# Patient Record
Sex: Male | Born: 1985 | Race: White | Hispanic: No | Marital: Single | Smoking: Current every day smoker
Health system: Southern US, Community
[De-identification: ages and names within clinical notes are randomized; demographics above are authoritative.]

## PROBLEM LIST (undated history)

## (undated) DIAGNOSIS — I10 Essential (primary) hypertension: Secondary | ICD-10-CM

---

## 2011-10-30 ENCOUNTER — Emergency Department: Payer: Self-pay | Admitting: Emergency Medicine

## 2011-10-30 LAB — URINALYSIS, COMPLETE
Ketone: NEGATIVE
Leukocyte Esterase: NEGATIVE
Ph: 6 (ref 4.5–8.0)
RBC,UR: 1 /HPF (ref 0–5)
Specific Gravity: 1.027 (ref 1.003–1.030)

## 2011-11-26 ENCOUNTER — Emergency Department: Payer: Self-pay | Admitting: Emergency Medicine

## 2011-12-01 ENCOUNTER — Ambulatory Visit: Payer: Self-pay | Admitting: Specialist

## 2011-12-16 ENCOUNTER — Ambulatory Visit: Payer: Self-pay | Admitting: Specialist

## 2012-10-21 ENCOUNTER — Emergency Department: Payer: Self-pay | Admitting: Emergency Medicine

## 2014-10-01 NOTE — Op Note (Signed)
PATIENT NAME:  Edward Maldonado, Edward Maldonado MR#:  [id number] DATE OF BIRTH:  06-30-1985  DATE OF PROCEDURE:  12/16/2011  PREOPERATIVE DIAGNOSES:    1. Complex displaced tear right lateral meniscus anterior half.  2. Loose bodies intercondylar notch and lateral femoral condyle.  3. 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   POSTOPERATIVE DIAGNOSES:    1. Complex displaced tear right lateral meniscus anterior half.  2. Loose bodies intercondylar notch and lateral femoral condyle.  3. 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   OPERATIONS:  1. Arthroscopic partial right lateral meniscectomy.  2. Arthroscopic loose body removal, right, x2.  3. Arthroscopic chondroplasty of the lateral femoral condyle. 4. Arthroscopic synovectomy and debridement.   SURGEON: Valinda Hoar, M.D.   ANESTHESIA: General LMA.   COMPLICATIONS: None.  DRAINS: None.   OPERATIVE FINDINGS: The patient had a loose body in the intercondylar notch and another loose body along the lateral femoral condyle. He had extensive scarring and synovitis in the lateral compartment anteriorly and laterally in the gutter. The anterior cruciate was intact and fairly stable. The medial compartment was normal. The lateral compartment showed a complex macerated tear of the lateral anterior half of the meniscus which was flipped on itself and blocking extension. The posterior half of the meniscus was intact and most of the weight-bearing surface of the lateral femoral condyle was intact. The suprapatellar pouch showed old scarring. The patellofemoral joint was normal.   OPERATIVE PROCEDURE: The patient was brought to the Operating Room where he underwent satisfactory general LMA anesthesia in the supine position. The right leg was prepped and draped in sterile fashion.  Arthroscopy was carried out through standard portals. The patient's history included surgery at age 29 or 15 for an avulsion type tibial spine injury. He had had problems with the knee on and off for years, but recently had acute pain and locking and inability to extend the knee. The above findings were encountered. The loose bodies were removed with the pituitary rongeurs. The anterolateral portion of the joint was debrided for exposure. Electrocautery was used for hemostasis. The probe showed the lateral meniscus was macerated and torn severely in the anterior half. This was resected with a motorized resector. This provided better visualization. The posterior half of the lateral compartment was more normal with good meniscus and weight-bearing surface. There was a large defect in the anterolateral aspect of the femoral condyle which was mostly nonweightbearing. This was debrided. The anterior cruciate was probed. There was slight loosening but to anterior drawer testing, it was fairly stable. The medial compartment was normal. The suprapatellar pouch had scarring which was debrided with the ArthroCare wand. The medial and lateral gutters were cleaned out. The joint was thoroughly irrigated. Pressures were reduced to allow for cautery of any bleeders. Once this was completed at the knee, we were able to bring him in full extension and good flexion. No other loose bodies were noted. The joint was suctioned dry and stab wound was closed with 3-0 nylon suture. 0.5% Marcaine with epinephrine and morphine were placed in the joint. Dry sterile compression dressing was applied. The tourniquet was not used. The patient was awakened and taken to recovery in good condition.  ____________________________ Valinda Hoar, MD hem:ap D: 12/16/2011 11:29:25 ET T: 12/16/2011 11:39:38 ET JOB#: [id number]  cc: Dimas Aguas  Samara DeistE. Karem Farha, MD, <Dictator> Valinda HoarHOWARD E Roc Streett MD ELECTRONICALLY SIGNED 12/19/2011 16:03

## 2019-03-14 ENCOUNTER — Emergency Department
Admission: EM | Admit: 2019-03-14 | Discharge: 2019-03-14 | Disposition: A | Discharge status: Home / Self Care | Payer: Self-pay | Attending: Emergency Medicine | Admitting: Emergency Medicine

## 2019-03-14 ENCOUNTER — Other Ambulatory Visit: Payer: Self-pay

## 2019-03-14 ENCOUNTER — Emergency Department: Payer: Self-pay

## 2019-03-14 DIAGNOSIS — N12 Tubulo-interstitial nephritis, not specified as acute or chronic: Secondary | ICD-10-CM | POA: Insufficient documentation

## 2019-03-14 DIAGNOSIS — F17200 Nicotine dependence, unspecified, uncomplicated: Secondary | ICD-10-CM | POA: Insufficient documentation

## 2019-03-14 DIAGNOSIS — R109 Unspecified abdominal pain: Secondary | ICD-10-CM

## 2019-03-14 LAB — CBC
HCT: 47.6 % (ref 39.0–52.0)
Hemoglobin: 17.3 g/dL — ABNORMAL HIGH (ref 13.0–17.0)
MCH: 31.9 pg (ref 26.0–34.0)
MCHC: 36.3 g/dL — ABNORMAL HIGH (ref 30.0–36.0)
MCV: 87.7 fL (ref 80.0–100.0)
Platelets: 179 10*3/uL (ref 150–400)
RBC: 5.43 MIL/uL (ref 4.22–5.81)
RDW: 13 % (ref 11.5–15.5)
WBC: 13.6 10*3/uL — ABNORMAL HIGH (ref 4.0–10.5)
nRBC: 0 % (ref 0.0–0.2)

## 2019-03-14 LAB — URINALYSIS, COMPLETE (UACMP) WITH MICROSCOPIC
Bilirubin Urine: NEGATIVE
Glucose, UA: NEGATIVE mg/dL
Ketones, ur: NEGATIVE mg/dL
Leukocytes,Ua: NEGATIVE
Nitrite: NEGATIVE
Protein, ur: 100 mg/dL — AB
Specific Gravity, Urine: 1.005 (ref 1.005–1.030)
pH: 5 (ref 5.0–8.0)

## 2019-03-14 LAB — COMPREHENSIVE METABOLIC PANEL
ALT: 34 U/L (ref 0–44)
AST: 42 U/L — ABNORMAL HIGH (ref 15–41)
Albumin: 4.3 g/dL (ref 3.5–5.0)
Alkaline Phosphatase: 74 U/L (ref 38–126)
Anion gap: 13 (ref 5–15)
BUN: 14 mg/dL (ref 6–20)
CO2: 21 mmol/L — ABNORMAL LOW (ref 22–32)
Calcium: 10 mg/dL (ref 8.9–10.3)
Chloride: 99 mmol/L (ref 98–111)
Creatinine, Ser: 1.98 mg/dL — ABNORMAL HIGH (ref 0.61–1.24)
GFR calc Af Amer: 50 mL/min — ABNORMAL LOW (ref >60)
GFR calc non Af Amer: 43 mL/min — ABNORMAL LOW (ref >60)
Glucose, Bld: 132 mg/dL — ABNORMAL HIGH (ref 70–99)
Potassium: 3.3 mmol/L — ABNORMAL LOW (ref 3.5–5.1)
Sodium: 133 mmol/L — ABNORMAL LOW (ref 135–145)
Total Bilirubin: 1 mg/dL (ref 0.3–1.2)
Total Protein: 7.2 g/dL (ref 6.5–8.1)

## 2019-03-14 LAB — LIPASE, BLOOD: Lipase: 22 U/L (ref 11–51)

## 2019-03-14 MED ORDER — IOHEXOL 300 MG/ML  SOLN
75.0000 mL | Freq: Once | INTRAMUSCULAR | Status: AC | PRN
  Administered 2019-03-14: 14:00:00 75 mL via INTRAVENOUS
  Filled 2019-03-14: qty 75

## 2019-03-14 MED ORDER — IOHEXOL 9 MG/ML PO SOLN
500.0000 mL | ORAL | Status: AC
  Administered 2019-03-14: 13:00:00 500 mL via ORAL
  Filled 2019-03-14 (×2): qty 500

## 2019-03-14 MED ORDER — ONDANSETRON 4 MG PO TBDP: 4.0000 mg | ORAL_TABLET | Freq: Three times a day (TID) | ORAL | 0 refills | Status: DC | PRN

## 2019-03-14 MED ORDER — MORPHINE SULFATE (PF) 4 MG/ML IV SOLN
4.0000 mg | Freq: Once | INTRAVENOUS | Status: AC
  Administered 2019-03-14: 4 mg via INTRAVENOUS
  Filled 2019-03-14: qty 1

## 2019-03-14 MED ORDER — MORPHINE SULFATE (PF) 4 MG/ML IV SOLN
4.0000 mg | Freq: Once | INTRAVENOUS | Status: AC
  Administered 2019-03-14: 14:00:00 4 mg via INTRAVENOUS
  Filled 2019-03-14: qty 1

## 2019-03-14 MED ORDER — SODIUM CHLORIDE 0.9 % IV BOLUS
1000.0000 mL | Freq: Once | INTRAVENOUS | Status: AC
  Administered 2019-03-14: 14:00:00 1000 mL via INTRAVENOUS

## 2019-03-14 MED ORDER — HYDROCODONE-ACETAMINOPHEN 5-325 MG PO TABS: 1.0000 | ORAL_TABLET | ORAL | 0 refills | Status: DC | PRN

## 2019-03-14 MED ORDER — SODIUM CHLORIDE 0.9% FLUSH
3.0000 mL | Freq: Once | INTRAVENOUS | Status: AC
  Administered 2019-03-14: 3 mL via INTRAVENOUS

## 2019-03-14 MED ORDER — ONDANSETRON HCL 4 MG/2ML IJ SOLN
4.0000 mg | Freq: Once | INTRAMUSCULAR | Status: AC
  Administered 2019-03-14: 4 mg via INTRAVENOUS
  Filled 2019-03-14: qty 2

## 2019-03-14 MED ORDER — SODIUM CHLORIDE 0.9 % IV SOLN
1.0000 g | Freq: Once | INTRAVENOUS | Status: AC
  Administered 2019-03-14: 1 g via INTRAVENOUS
  Filled 2019-03-14: qty 10

## 2019-03-14 MED ORDER — CEPHALEXIN 500 MG PO CAPS: 500.0000 mg | ORAL_CAPSULE | Freq: Two times a day (BID) | ORAL | 0 refills | Status: DC

## 2019-03-14 NOTE — ED Notes (Signed)
Pt up to toilet 

## 2019-03-14 NOTE — ED Notes (Signed)
Patient transported to CT 

## 2019-03-14 NOTE — ED Triage Notes (Signed)
Pt comes via POV from home with c/o lower back pain that radiates around to belly.  Pt denies any urinary symptoms.  Pt states dark stools. Pt states this all started last night. Pt denies any N/V/D.

## 2019-03-14 NOTE — ED Notes (Signed)
Pt ambulatory to treatment bed but reporting pain has started to worsen in back and lower abd after sitting in chairs in lobby. Pt hunching slightly when walking but reports slight ease in pain when laying down.

## 2019-03-14 NOTE — ED Provider Notes (Signed)
[facility] Emergency Department Provider Note  Time seen: 1:29 PM  I have reviewed the triage vital signs and the nursing notes.   HISTORY  Chief Complaint Abdominal Pain   HPI Edward Maldonado is a 33 y.o. male with no significant past medical history presents to the emergency department for lower abdominal pain and back pain.  According to the patient since last night he has been experiencing pain in his bilateral flanks radiating around to his lower abdomen.  Denies any fever.  Denies any shortness of breath.  Denies any nausea vomiting diarrhea dysuria or hematuria.  Patient states he got minimal sleep last night due to the discomfort.  Describes as a moderate aching pain in his bilateral flanks radiating around to his bilateral lower abdomen.   History reviewed. No pertinent past medical history.  There are no active problems to display for this patient.   History reviewed. No pertinent surgical history.  Prior to Admission medications   Not on File    Not on File  No family history on file.  Social History Social History   Tobacco Use  . Smoking status: Current Every Day Smoker  . Smokeless tobacco: Never Used  Substance Use Topics  . Alcohol use: Never    Frequency: Never  . Drug use: Never    Review of Systems Constitutional: Negative for fever. Cardiovascular: Negative for chest pain. Respiratory: Negative for shortness of breath. Gastrointestinal: Positive for bilateral flank pain, lower abdominal pain.  Negative for nausea vomiting or diarrhea. Genitourinary: Negative for urinary compaints Musculoskeletal: Negative for musculoskeletal complaints Skin: Negative for skin complaints  Neurological: Negative for headache All other ROS negative  ____________________________________________   PHYSICAL EXAM:  VITAL SIGNS: ED Triage Vitals [03/14/19 1153]  Enc Vitals Group     BP (!) 157/102     Pulse Rate 80     Resp 19   Temp 98.3 F (36.8 C)     Temp src      SpO2 100 %     Weight 215 lb (97.5 kg)     Height 5\' 11"  (1.803 m)     Head Circumference      Peak Flow      Pain Score 10     Pain Loc      Pain Edu?      Excl. in GC?    Constitutional: Alert and oriented. Well appearing and in no distress. Eyes: Normal exam ENT      Head: Normocephalic and atraumatic.      Mouth/Throat: Mucous membranes are moist. Cardiovascular: Normal rate, regular rhythm.  Respiratory: Normal respiratory effort without tachypnea nor retractions. Breath sounds are clear Gastrointestinal: Soft and nontender. No distention.  Musculoskeletal: Nontender with normal range of motion in all extremities.  Neurologic:  Normal speech and language. No gross focal neurologic deficits Skin:  Skin is warm, dry and intact.  Psychiatric: Mood and affect are normal.   ____________________________________________     RADIOLOGY  CT scan shows suspicion for bilateral pyelonephritis.  Remainder CT is negative.  ____________________________________________   INITIAL IMPRESSION / ASSESSMENT AND PLAN / ED COURSE  Pertinent labs & imaging results that were available during my care of the patient were reviewed by me and considered in my medical decision making (see chart for details).   Patient presents to the emergency department for abdominal/flank pain.  Differential would include ureterolithiasis, UTI or pyelonephritis, colitis or diverticulitis.  Patient's lab work has resulted showing a moderate leukocytosis.  Given the patient's complaint of abdominal pain with leukocytosis we will proceed with CT imaging of the abdomen/pelvis to rule out intra-abdominal pathology.  Patient agreeable to plan of care.  CT is suspicious for possible bilateral pyelonephritis.  Patient's urinalysis overall is reassuring however there is rare bacteria seen.  We will cover with antibiotics.  We will send a urine culture.  We will discharge with  antibiotics pain and nausea medication have the patient follow-up with nephrology and a PCP.  Patient agreeable to plan of care.  Patient's lab work did show renal insufficiency.  No prior lab work for comparison however when I discussed this with the patient he states he has been told before that his kidneys were not working 100%, possible chronic renal insufficiency however given these findings patient would still benefit from nephrology follow-up.  Edward Maldonado was evaluated in Emergency Department on 03/14/2019 for the symptoms described in the history of present illness. He was evaluated in the context of the global COVID-19 pandemic, which necessitated consideration that the patient might be at risk for infection with the SARS-CoV-2 virus that causes COVID-19. Institutional protocols and algorithms that pertain to the evaluation of patients at risk for COVID-19 are in a state of rapid change based on information released by regulatory bodies including the CDC and federal and state organizations. These policies and algorithms were followed during the patient's care in the ED.  ____________________________________________   FINAL CLINICAL IMPRESSION(S) / ED DIAGNOSES  Abdominal pain Pyelonephritis   Harvest Dark, MD 03/14/19 1515

## 2019-03-15 LAB — URINE CULTURE: Culture: NO GROWTH

## 2019-04-26 ENCOUNTER — Other Ambulatory Visit: Payer: Self-pay

## 2019-04-26 DIAGNOSIS — Z20822 Contact with and (suspected) exposure to covid-19: Secondary | ICD-10-CM

## 2019-04-28 LAB — NOVEL CORONAVIRUS, NAA: SARS-CoV-2, NAA: NOT DETECTED

## 2019-06-27 ENCOUNTER — Ambulatory Visit: Payer: Self-pay | Attending: Internal Medicine

## 2019-06-27 DIAGNOSIS — Z20822 Contact with and (suspected) exposure to covid-19: Secondary | ICD-10-CM | POA: Insufficient documentation

## 2019-06-28 LAB — NOVEL CORONAVIRUS, NAA: SARS-CoV-2, NAA: NOT DETECTED

## 2019-10-26 ENCOUNTER — Other Ambulatory Visit: Payer: Self-pay

## 2019-10-26 ENCOUNTER — Encounter: Payer: Self-pay | Admitting: Emergency Medicine

## 2019-10-26 ENCOUNTER — Emergency Department
Admission: EM | Admit: 2019-10-26 | Discharge: 2019-10-26 | Disposition: A | Discharge status: Home / Self Care | Payer: Self-pay | Attending: Emergency Medicine | Admitting: Emergency Medicine

## 2019-10-26 ENCOUNTER — Emergency Department: Payer: Self-pay

## 2019-10-26 DIAGNOSIS — B349 Viral infection, unspecified: Secondary | ICD-10-CM

## 2019-10-26 DIAGNOSIS — F17200 Nicotine dependence, unspecified, uncomplicated: Secondary | ICD-10-CM | POA: Insufficient documentation

## 2019-10-26 DIAGNOSIS — Z20822 Contact with and (suspected) exposure to covid-19: Secondary | ICD-10-CM | POA: Insufficient documentation

## 2019-10-26 DIAGNOSIS — Z79899 Other long term (current) drug therapy: Secondary | ICD-10-CM | POA: Insufficient documentation

## 2019-10-26 LAB — SARS CORONAVIRUS 2 BY RT PCR (HOSPITAL ORDER, PERFORMED IN ~~LOC~~ HOSPITAL LAB): SARS Coronavirus 2: NEGATIVE

## 2019-10-26 MED ORDER — GUAIFENESIN-CODEINE 100-10 MG/5ML PO SYRP: 5.0000 mL | ORAL_SOLUTION | Freq: Three times a day (TID) | ORAL | 0 refills | Status: DC | PRN

## 2019-10-26 MED ORDER — PREDNISONE 10 MG (21) PO TBPK: ORAL_TABLET | ORAL | 0 refills | Status: DC

## 2019-10-26 MED ORDER — IBUPROFEN 600 MG PO TABS: 600.0000 mg | ORAL_TABLET | Freq: Four times a day (QID) | ORAL | 0 refills | Status: DC | PRN

## 2019-10-26 NOTE — ED Provider Notes (Signed)
[facility] Emergency Department Provider Note  ____________________________________________  Time seen: Approximately 1:24 PM  I have reviewed the triage vital signs and the nursing notes.   HISTORY  Chief Complaint URI, Otalgia, Fever, Cough, and Generalized Body Aches   HPI Edward Maldonado is a 34 y.o. male who presents to the emergency department for treatment and evaluation of fever, cough, earache, and generalized body aches x 2 days. No relief with tylenol.     History reviewed. No pertinent past medical history.  There are no problems to display for this patient.   History reviewed. No pertinent surgical history.  Prior to Admission medications   Medication Sig Start Date End Date Taking? Authorizing Provider  cephALEXin (KEFLEX) 500 MG capsule Take 1 capsule (500 mg total) by mouth 2 (two) times daily. 03/14/19   Minna Antis, MD  guaiFENesin-codeine (ROBITUSSIN AC) 100-10 MG/5ML syrup Take 5 mLs by mouth 3 (three) times daily as needed for cough. 10/26/19   Triplett, Rulon Eisenmenger B, FNP  HYDROcodone-acetaminophen (NORCO/VICODIN) 5-325 MG tablet Take 1 tablet by mouth every 4 (four) hours as needed. 03/14/19   Minna Antis, MD  ibuprofen (ADVIL) 600 MG tablet Take 1 tablet (600 mg total) by mouth every 6 (six) hours as needed. 10/26/19   Triplett, Cari B, FNP  ondansetron (ZOFRAN ODT) 4 MG disintegrating tablet Take 1 tablet (4 mg total) by mouth every 8 (eight) hours as needed for nausea or vomiting. 03/14/19   Minna Antis, MD  predniSONE (STERAPRED UNI-PAK 21 TAB) 10 MG (21) TBPK tablet Take 6 tablets on the first day and decrease by 1 tablet each day until finished. 10/26/19   Chinita Pester, FNP    Allergies Patient has no allergy information on record.  No family history on file.  Social History Social History   Tobacco Use  . Smoking status: Current Every Day Smoker  . Smokeless tobacco: Never Used  Substance Use Topics   . Alcohol use: Never  . Drug use: Never    Review of Systems Constitutional: Positive fever/chills. Decreased appetite. ENT: Positive for sore throat. Cardiovascular: Denies chest pain. Respiratory: Negative for shortness of breath. Positive for cough. Positive wheezing.  Gastrointestinal: Negative for  nausea,  no vomiting.  No diarrhea.  Musculoskeletal: Positive for body aches Skin: Negative for rash. Neurological: Positive for headaches ____________________________________________   PHYSICAL EXAM:  VITAL SIGNS: ED Triage Vitals  Enc Vitals Group     BP 10/26/19 1039 (!) 154/93     Pulse Rate 10/26/19 1039 86     Resp 10/26/19 1039 16     Temp 10/26/19 1039 98 F (36.7 C)     Temp Source 10/26/19 1039 Oral     SpO2 10/26/19 1039 96 %     Weight 10/26/19 1037 215 lb (97.5 kg)     Height 10/26/19 1037 5\' 11"  (1.803 m)     Head Circumference --      Peak Flow --      Pain Score 10/26/19 1037 7     Pain Loc --      Pain Edu? --      Excl. in GC? --     Constitutional: Alert and oriented. Acutely ill appearing and in no acute distress. Eyes: Conjunctivae are normal. Ears: Bilateral TM injected. Nose: Maxillary sinus congestion noted; no rhinnorhea. Mouth/Throat: Mucous membranes are moist.  Oropharynx erythematou. Tonsils without exudate. Uvula midline. Neck: No stridor.  Lymphatic: No cervical lymphadenopathy. Cardiovascular: Normal rate, regular rhythm. Good  peripheral circulation. Respiratory: Respirations are even and unlabored.  No retractions. Wheezing left upper lobe. Gastrointestinal: Soft and nontender.  Musculoskeletal: FROM x 4 extremities.  Neurologic:  Normal speech and language. Skin:  Skin is warm, dry and intact. No rash noted. Psychiatric: Mood and affect are normal. Speech and behavior are normal.  ____________________________________________   LABS (all labs ordered are listed, but only abnormal results are displayed)  Labs Reviewed  SARS  CORONAVIRUS 2 BY RT PCR (HOSPITAL ORDER, [facility])   ____________________________________________  EKG  Not indicated. ____________________________________________  RADIOLOGY  Chest x-ray negative for acute findings. ____________________________________________   PROCEDURES  Procedure(s) performed: None  Critical Care performed: No ____________________________________________   INITIAL IMPRESSION / ASSESSMENT AND PLAN / ED COURSE  34 y.o. male presents to the emergency department for treatment and evaluation of flulike symptoms.  Symptoms started 2 days ago.  See HPI for further details.  No relief with Tylenol.  Concern for COVID-19.  Will obtain nasal swab and send the lab.  Also noted some wheezing in the left upper lobe.  Patient does smoke cigarettes.  Chest x-ray ordered.   COVID negative, which is surprising. He will be discharged home with Rx for Robitussin AC, prednisone, and ibuprofen. He is also to take tylenol if needed for body aches and fever. Work note provided as well. He is to follow up with primary care or return to the ER for symptoms of concern.   Medications - No data to display  ED Discharge Orders         Ordered    guaiFENesin-codeine (ROBITUSSIN AC) 100-10 MG/5ML syrup  3 times daily PRN     10/26/19 1446    predniSONE (STERAPRED UNI-PAK 21 TAB) 10 MG (21) TBPK tablet     10/26/19 1446    ibuprofen (ADVIL) 600 MG tablet  Every 6 hours PRN     10/26/19 1446           Pertinent labs & imaging results that were available during my care of the patient were reviewed by me and considered in my medical decision making (see chart for details).    If controlled substance prescribed during this visit, 12 month history viewed on the [facility] prior to issuing an initial prescription for Schedule II or III opiod. ____________________________________________   FINAL CLINICAL IMPRESSION(S) / ED DIAGNOSES  Final diagnoses:   Viral syndrome    Note:  This document was prepared using Dragon voice recognition software and may include unintentional dictation errors.    Victorino Dike, FNP 10/26/19 1530    Duffy Bruce, MD 10/27/19 2029

## 2019-10-26 NOTE — ED Triage Notes (Signed)
Pt reports flu-like sx's for the last 2 days. Pt c/o cough, bodyaches, fevers and earaches

## 2019-10-26 NOTE — ED Notes (Signed)
See triage note- pt here with cough, congestion, and generalized body aches x2 days. Has been unable to eat or drink anything.

## 2019-11-18 ENCOUNTER — Emergency Department: Payer: Self-pay

## 2019-11-18 ENCOUNTER — Emergency Department
Admission: EM | Admit: 2019-11-18 | Discharge: 2019-11-18 | Disposition: A | Discharge status: Home / Self Care | Payer: Self-pay | Attending: Emergency Medicine | Admitting: Emergency Medicine

## 2019-11-18 ENCOUNTER — Other Ambulatory Visit: Payer: Self-pay

## 2019-11-18 ENCOUNTER — Encounter: Payer: Self-pay | Admitting: Emergency Medicine

## 2019-11-18 DIAGNOSIS — F17210 Nicotine dependence, cigarettes, uncomplicated: Secondary | ICD-10-CM | POA: Insufficient documentation

## 2019-11-18 DIAGNOSIS — H10211 Acute toxic conjunctivitis, right eye: Secondary | ICD-10-CM | POA: Insufficient documentation

## 2019-11-18 DIAGNOSIS — T59891A Toxic effect of other specified gases, fumes and vapors, accidental (unintentional), initial encounter: Secondary | ICD-10-CM | POA: Insufficient documentation

## 2019-11-18 DIAGNOSIS — M25551 Pain in right hip: Secondary | ICD-10-CM | POA: Insufficient documentation

## 2019-11-18 MED ORDER — CYCLOBENZAPRINE HCL 10 MG PO TABS: 10.0000 mg | ORAL_TABLET | Freq: Three times a day (TID) | ORAL | 0 refills | Status: DC | PRN

## 2019-11-18 MED ORDER — IBUPROFEN 600 MG PO TABS: 600.0000 mg | ORAL_TABLET | Freq: Three times a day (TID) | ORAL | 0 refills | Status: DC | PRN

## 2019-11-18 MED ORDER — NAPHAZOLINE-PHENIRAMINE 0.025-0.3 % OP SOLN: 1.0000 [drp] | Freq: Four times a day (QID) | OPHTHALMIC | 0 refills | Status: DC | PRN

## 2019-11-18 MED ORDER — TRAMADOL HCL 50 MG PO TABS: 50.0000 mg | ORAL_TABLET | Freq: Four times a day (QID) | ORAL | 0 refills | Status: DC | PRN

## 2019-11-18 NOTE — ED Triage Notes (Signed)
Presents with pain to right hip   States pain started couple of days ago w/o injury  Also having some pain to right eye  Eye is red and irritated

## 2019-11-18 NOTE — ED Provider Notes (Signed)
[facility] Emergency Department Provider Note   ____________________________________________   First MD Initiated Contact with Patient 11/18/19 1248     (approximate)  I have reviewed the triage vital signs and the nursing notes.   HISTORY  Chief Complaint No chief complaint on file.    HPI Edward Maldonado is a 34 y.o. male patient complaint of 2 days of nontraumatic increasing right hip pain.  Patient state pain starts at the posterior hip and radiates down to mid thigh.  Patient rates pain as a 9/10.  Patient described pain is "achy".  States no relief with over-the-counter ibuprofen 600 mg.  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  Patient denies vision disturbance.  Patient states eye is red and itching.  No palliative measure for complaint.         History reviewed. No pertinent past medical history.  There are no problems to display for this patient.   History reviewed. No pertinent surgical history.  Prior to Admission medications   Medication Sig Start Date End Date Taking? Authorizing Provider  cyclobenzaprine (FLEXERIL) 10 MG tablet Take 1 tablet (10 mg total) by mouth 3 (three) times daily as needed. 11/18/19   Sable Feil, PA-C  ibuprofen (ADVIL) 600 MG tablet Take 1 tablet (600 mg total) by mouth every 8 (eight) hours as needed. 11/18/19   Sable Feil, PA-C  naphazoline-pheniramine (NAPHCON-A) 0.025-0.3 % ophthalmic solution Place 1 drop into the right eye 4 (four) times daily as needed for eye irritation. 11/18/19   Sable Feil, PA-C  traMADol (ULTRAM) 50 MG tablet Take 1 tablet (50 mg total) by mouth every 6 (six) hours as needed. 11/18/19 11/17/20  Sable Feil, PA-C    Allergies Patient has no known allergies.  No family history on file.  Social History Social History   Tobacco Use  . Smoking status: Current Every Day Smoker   . Smokeless tobacco: Never Used  Substance Use Topics  . Alcohol use: Never  . Drug use: Never    Review of Systems Constitutional: No fever/chills Eyes: No visual changes.  Redness right eye for itching. ENT: No sore throat. Cardiovascular: Denies chest pain. Respiratory: Denies shortness of breath. Gastrointestinal: No abdominal pain.  No nausea, no vomiting.  No diarrhea.  No constipation. Genitourinary: Negative for dysuria. Musculoskeletal: Right hip pain.   Skin: Negative for rash. Neurological: Negative for headaches, focal weakness or numbness.   ____________________________________________   PHYSICAL EXAM:  VITAL SIGNS: ED Triage Vitals  Enc Vitals Group     BP 11/18/19 1205 (!) 159/97     Pulse Rate 11/18/19 1205 82     Resp 11/18/19 1205 16     Temp 11/18/19 1205 98.3 F (36.8 C)     Temp Source 11/18/19 1205 Oral     SpO2 11/18/19 1205 96 %     Weight --      Height --      Head Circumference --      Peak Flow --      Pain Score 11/18/19 1208 9     Pain Loc --      Pain Edu? --      Excl. in [place]? --    Constitutional: Alert and oriented. Well appearing and in no acute distress. Eyes: Erythematous right conjunctiva. PERRL. EOMI. Cardiovascular: Normal rate, regular rhythm. Grossly normal heart sounds.  Good peripheral circulation.  Elevated blood  pressure. Respiratory: Normal respiratory effort.  No retractions. Lungs CTAB. Musculoskeletal: No obvious deformity to the right hip.  No leg length discrepancy.  Patient has full equal range of motion of the hip.   Neurologic:  Normal speech and language. No gross focal neurologic deficits are appreciated. No gait instability. Skin:  Skin is warm, dry and intact. No rash noted. Psychiatric: Mood and affect are normal. Speech and behavior are normal.  ____________________________________________   LABS (all labs ordered are listed, but only abnormal results are displayed)  Labs Reviewed - No data to  display ____________________________________________  EKG   ____________________________________________  RADIOLOGY  ED MD interpretation:    Official radiology report(s): DG Hip Unilat W or Wo Pelvis 2-3 Views Right  Result Date: 11/18/2019 CLINICAL DATA:  Two days of increasing nontraumatic RIGHT hip pain EXAM: DG HIP (WITH OR WITHOUT PELVIS) 2-3V RIGHT COMPARISON:  CT abdomen and pelvis from 2020 FINDINGS: There is no evidence of hip fracture or dislocation. There is no evidence of arthropathy or other focal bone abnormality. IMPRESSION: Negative evaluation of the pelvis and RIGHT hip. Electronically Signed   By: Donzetta Kohut M.D.   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  Patient denies vision loss.  Patient also complain of right hip pain for few days.  Discussed negative x-ray findings of the hip with patient.  Patient given discharge care instruction advised use eyedrops and take medication as directed.  Patient [facility]    Edward Maldonado was evaluated in Emergency Department on 11/18/2019 for the symptoms described in the history of present illness. He was evaluated in the context of the global COVID-19 pandemic, which necessitated consideration that the patient might be at risk for infection with the SARS-CoV-2 virus that causes COVID-19. Institutional protocols and algorithms that pertain to the evaluation of patients at risk for COVID-19 are in a state of rapid change based on  information released by regulatory bodies including the CDC and federal and state organizations. These policies and algorithms were followed during the patient's care in the ED.       ____________________________________________   FINAL CLINICAL IMPRESSION(S) / ED DIAGNOSES  Final diagnoses:  Acute right hip pain  Chemical conjunctivitis of right eye     ED Discharge Orders         Ordered    naphazoline-pheniramine (NAPHCON-A) 0.025-0.3 % ophthalmic solution  4 times daily PRN     Discontinue  Reprint     11/18/19 1410    traMADol (ULTRAM) 50 MG tablet  Every 6 hours PRN     Discontinue  Reprint     11/18/19 1410    ibuprofen (ADVIL) 600 MG tablet  Every 8 hours PRN     Discontinue  Reprint     11/18/19 1410    cyclobenzaprine (FLEXERIL) 10 MG tablet  3 times daily PRN     Discontinue  Reprint     11/18/19 1410           Note:  This document was prepared using Dragon voice recognition software and may  include unintentional dictation errors.    Joni Reining, PA-C 11/18/19 1413    Minna Antis, MD 11/18/19 1443

## 2019-11-18 NOTE — Discharge Instructions (Signed)
No acute findings on x-ray of your hip and pelvis.  Advised to establish care with PCP for further evaluation and treatment.  Use eyedrops as directed.

## 2019-12-27 ENCOUNTER — Telehealth: Payer: Self-pay | Admitting: General Practice

## 2019-12-27 NOTE — Telephone Encounter (Signed)
Individual has been contacted 3+ times regarding ED referral. No further attempts to contact individual will be made. 

## 2020-04-10 ENCOUNTER — Emergency Department: Payer: Self-pay

## 2020-04-10 ENCOUNTER — Other Ambulatory Visit: Payer: Self-pay

## 2020-04-10 ENCOUNTER — Emergency Department
Admission: EM | Admit: 2020-04-10 | Discharge: 2020-04-10 | Disposition: A | Discharge status: Home / Self Care | Payer: Self-pay | Attending: Emergency Medicine | Admitting: Emergency Medicine

## 2020-04-10 DIAGNOSIS — S60222A Contusion of left hand, initial encounter: Secondary | ICD-10-CM | POA: Insufficient documentation

## 2020-04-10 DIAGNOSIS — W1789XA Other fall from one level to another, initial encounter: Secondary | ICD-10-CM | POA: Insufficient documentation

## 2020-04-10 DIAGNOSIS — F17200 Nicotine dependence, unspecified, uncomplicated: Secondary | ICD-10-CM | POA: Insufficient documentation

## 2020-04-10 DIAGNOSIS — Z8781 Personal history of (healed) traumatic fracture: Secondary | ICD-10-CM | POA: Insufficient documentation

## 2020-04-10 MED ORDER — OXYCODONE HCL 5 MG PO TABS: 5.0000 mg | ORAL_TABLET | Freq: Four times a day (QID) | ORAL | 0 refills | Status: DC | PRN

## 2020-04-10 MED ORDER — OXYCODONE-ACETAMINOPHEN 7.5-325 MG PO TABS
1.0000 | ORAL_TABLET | Freq: Once | ORAL | Status: AC
  Administered 2020-04-10: 1 via ORAL
  Filled 2020-04-10: qty 1

## 2020-04-10 NOTE — ED Triage Notes (Signed)
Pt comes via POV from home with c/o left hand/ arm pain. Pt states he fell off his truck. Pt has redness and swelling noted to hand.  Pt denies LOC or hitting head

## 2020-04-10 NOTE — ED Provider Notes (Signed)
[facility] Emergency Department Provider Note  ____________________________________________   First MD Initiated Contact with Patient 04/10/20 1325     (approximate)  I have reviewed the triage vital signs and the nursing notes.   HISTORY  Chief Complaint Arm Pain    HPI Edward Maldonado is a 34 y.o. male presents to the ED with complaint of left hand pain after he fell off the back of his truck.  Patient states that he has had previous injury in which he had surgery on his wrist.  He denies any head injury or loss of consciousness during this injury.  He denies any other injuries.  He rates his pain as 10/10.      History reviewed. No pertinent past medical history.  There are no problems to display for this patient.   History reviewed. No pertinent surgical history.  Prior to Admission medications   Medication Sig Start Date End Date Taking? Authorizing Provider  naphazoline-pheniramine (NAPHCON-A) 0.025-0.3 % ophthalmic solution Place 1 drop into the right eye 4 (four) times daily as needed for eye irritation. 11/18/19   Joni Reining, PA-C  oxyCODONE (OXY IR/ROXICODONE) 5 MG immediate release tablet Take 1 tablet (5 mg total) by mouth every 6 (six) hours as needed for severe pain. 04/10/20   Tommi Rumps, PA-C    Allergies Patient has no known allergies.  No family history on file.  Social History Social History   Tobacco Use  . Smoking status: Current Every Day Smoker  . Smokeless tobacco: Never Used  Substance Use Topics  . Alcohol use: Never  . Drug use: Never    Review of Systems Constitutional: No fever/chills Eyes: No visual changes. ENT: No trauma. Cardiovascular: Denies chest pain. Respiratory: Denies shortness of breath. Gastrointestinal: No abdominal pain.  No nausea, no vomiting.  Musculoskeletal: Positive for left hand and wrist pain. Skin: Negative for rash. Neurological: Negative for headaches, focal  weakness or numbness. ____________________________________________   PHYSICAL EXAM:  VITAL SIGNS: ED Triage Vitals  Enc Vitals Group     BP 04/10/20 1309 (!) 160/110     Pulse Rate 04/10/20 1309 69     Resp 04/10/20 1309 18     Temp 04/10/20 1309 98 F (36.7 C)     Temp src --      SpO2 04/10/20 1309 100 %     Weight 04/10/20 1308 210 lb (95.3 kg)     Height 04/10/20 1308 5\' 11"  (1.803 m)     Head Circumference --      Peak Flow --      Pain Score 04/10/20 1308 10     Pain Loc --      Pain Edu? --      Excl. in GC? --     Constitutional: Alert and oriented. Well appearing and in no acute distress. Eyes: Conjunctivae are normal.  Head: Atraumatic. Nose: No trauma. Neck: No stridor.   Cardiovascular: Normal rate, regular rhythm. Grossly normal heart sounds.  Good peripheral circulation. Respiratory: Normal respiratory effort.  No retractions. Lungs CTAB. Gastrointestinal: Soft and nontender. No distention. No abdominal bruits. No CVA tenderness. Musculoskeletal: Examination of the left hand there is no gross deformity however patient is markedly tender on palpation of the fifth and fourth metacarpal area.  Patient has soft tissue edema.  There is also a well-healed surgical scar from an internal fixation of previous fracture that happened 10 years ago.  Patient is able to move digits without any  difficulty.  Motor sensory function intact.  Skin is intact.  Capillary refills less than 3 seconds.  Patient has limited flexion and extension of his wrist secondary to pain but no gross deformity is noted.  Pulses present. Neurologic:  Normal speech and language. No gross focal neurologic deficits are appreciated. No gait instability. Skin:  Skin is warm, dry and intact.  Psychiatric: Mood and affect are normal. Speech and behavior are normal.  ____________________________________________   LABS (all labs ordered are listed, but only abnormal results are displayed)  Labs Reviewed -  No data to display   RADIOLOGY I, Tommi Rumps, personally viewed and evaluated these images (plain radiographs) as part of my medical decision making, as well as reviewing the written report by the radiologist.   Official radiology report(s): DG Hand Complete Left  Result Date: 04/10/2020 CLINICAL DATA:  Left hand pain after fall. EXAM: LEFT HAND - COMPLETE 3+ VIEW COMPARISON:  None. FINDINGS: There is no evidence of acute fracture or dislocation. There is no evidence of arthropathy. Status post surgical repair of old fifth metacarpal fracture. Soft tissues are unremarkable. IMPRESSION: No acute abnormality seen in the left hand. Electronically Signed   By: Lupita Raider M.D.   On: 04/10/2020 14:19    ____________________________________________   PROCEDURES  Procedure(s) performed (including Critical Care):  Procedures  Patient was placed in a prefabricated wrist splint by nursing staff. ____________________________________________   INITIAL IMPRESSION / ASSESSMENT AND PLAN / ED COURSE  As part of my medical decision making, I reviewed the following data within the electronic MEDICAL RECORD NUMBER Notes from prior ED visits and [place] Controlled Substance Database  34 year old male presents to the ED with an injury to his left hand that occurred when he fell off his truck today.  Patient has a history of a fracture to this hand that occurred 10 years ago which required internal fixation.  He denies any other injuries.  X-rays were negative for any acute changes on today's x-rays.  Patient was given oxycodone while in the ED for pain.  He was placed in a prefabricated wrist splint.  He is encouraged to ice and elevate as needed for pain and swelling.  A prescription for oxycodone 5 mg was sent to his pharmacy.  He is to follow-up with Dr. Signa Kell who is on-call for orthopedics if any continued problems with his hand.  ____________________________________________   FINAL CLINICAL  IMPRESSION(S) / ED DIAGNOSES  Final diagnoses:  Contusion of left hand, initial encounter     ED Discharge Orders         Ordered    oxyCODONE (OXY IR/ROXICODONE) 5 MG immediate release tablet  Every 6 hours PRN        04/10/20 1438          *Please note:  Kajuan Guyton was evaluated in Emergency Department on 04/10/2020 for the symptoms described in the history of present illness. He was evaluated in the context of the global COVID-19 pandemic, which necessitated consideration that the patient might be at risk for infection with the SARS-CoV-2 virus that causes COVID-19. Institutional protocols and algorithms that pertain to the evaluation of patients at risk for COVID-19 are in a state of rapid change based on information released by regulatory bodies including the CDC and federal and state organizations. These policies and algorithms were followed during the patient's care in the ED.  Some ED evaluations and interventions may be delayed as a result of limited staffing during  and the pandemic.*   Note:  This document was prepared using Dragon voice recognition software and may include unintentional dictation errors.    Tommi Rumps, PA-C 04/10/20 1716    Delton Pra[place], MD 04/11/20 [phone]

## 2020-04-10 NOTE — Discharge Instructions (Addendum)
Follow-up with Dr. Allena Katz who is the orthopedist on call if any continued problems with your hand or wrist.  Wear the splint for support and protection.  Ice and elevation to reduce swelling and help with pain.  A prescription for pain medication was sent to your pharmacy to take for the next couple of days.  Also you may take ibuprofen every 6 hours for inflammation which will also help with pain.

## 2020-08-08 ENCOUNTER — Other Ambulatory Visit: Payer: Self-pay

## 2020-08-08 ENCOUNTER — Emergency Department
Admission: EM | Admit: 2020-08-08 | Discharge: 2020-08-08 | Disposition: A | Discharge status: Home / Self Care | Payer: Self-pay | Attending: Student in an Organized Health Care Education/Training Program | Admitting: Student in an Organized Health Care Education/Training Program

## 2020-08-08 DIAGNOSIS — F17200 Nicotine dependence, unspecified, uncomplicated: Secondary | ICD-10-CM | POA: Insufficient documentation

## 2020-08-08 DIAGNOSIS — K0889 Other specified disorders of teeth and supporting structures: Secondary | ICD-10-CM | POA: Insufficient documentation

## 2020-08-08 MED ORDER — OXYCODONE HCL 5 MG PO TABS: 5.0000 mg | ORAL_TABLET | Freq: Four times a day (QID) | ORAL | 0 refills | Status: DC | PRN

## 2020-08-08 MED ORDER — AMOXICILLIN 500 MG PO CAPS: 500.0000 mg | ORAL_CAPSULE | Freq: Three times a day (TID) | ORAL | 0 refills | Status: DC

## 2020-08-08 MED ORDER — LIDOCAINE VISCOUS HCL 2 % MT SOLN: 5.0000 mL | Freq: Four times a day (QID) | OROMUCOSAL | 0 refills | Status: DC | PRN

## 2020-08-08 MED ORDER — IBUPROFEN 800 MG PO TABS: 800.0000 mg | ORAL_TABLET | Freq: Three times a day (TID) | ORAL | 0 refills | Status: DC | PRN

## 2020-08-08 MED ORDER — LIDOCAINE VISCOUS HCL 2 % MT SOLN
15.0000 mL | Freq: Once | OROMUCOSAL | Status: AC
  Administered 2020-08-08: 15 mL via OROMUCOSAL
  Filled 2020-08-08: qty 15

## 2020-08-08 NOTE — ED Provider Notes (Signed)
[facility] Emergency Department Provider Note   ____________________________________________   Event Date/Time   First MD Initiated Contact with Patient 08/08/20 [phone]     (approximate)  I have reviewed the triage vital signs and the nursing notes.   HISTORY  Chief Complaint Dental Pain    HPI Edward Maldonado is a 35 y.o. male patient presents with dental pain for 1 week.  Patient the pain worsened overnight.  Patient stated mild transient relief with ibuprofen.  Rates pain as a 10/10.  Described pain is "achy".  Pain is located left upper molar area.         History reviewed. No pertinent past medical history.  There are no problems to display for this patient.   History reviewed. No pertinent surgical history.  Prior to Admission medications   Medication Sig Start Date End Date Taking? Authorizing Provider  amoxicillin (AMOXIL) 500 MG capsule Take 1 capsule (500 mg total) by mouth 3 (three) times daily. 08/08/20  Yes Joni Reining, PA-C  ibuprofen (ADVIL) 800 MG tablet Take 1 tablet (800 mg total) by mouth every 8 (eight) hours as needed. 08/08/20  Yes Joni Reining, PA-C  lidocaine (XYLOCAINE) 2 % solution Use as directed 5 mLs in the mouth or throat every 6 (six) hours as needed for mouth pain. For oral swish 08/08/20  Yes Joni Reining, PA-C  naphazoline-pheniramine (NAPHCON-A) 0.025-0.3 % ophthalmic solution Place 1 drop into the right eye 4 (four) times daily as needed for eye irritation. 11/18/19   Joni Reining, PA-C  oxyCODONE (OXY IR/ROXICODONE) 5 MG immediate release tablet Take 1 tablet (5 mg total) by mouth every 6 (six) hours as needed for severe pain. 08/08/20   Joni Reining, PA-C    Allergies Patient has no known allergies.  No family history on file.  Social History Social History   Tobacco Use  . Smoking status: Current Every Day Smoker  . Smokeless tobacco: Never Used  Substance Use Topics  . Alcohol use: Never   . Drug use: Never    Review of Systems Constitutional: No fever/chills Eyes: No visual changes. ENT: No sore throat.  Dental pain. Cardiovascular: Denies chest pain. Respiratory: Denies shortness of breath. Gastrointestinal: No abdominal pain.  No nausea, no vomiting.  No diarrhea.  No constipation. Genitourinary: Negative for dysuria. Musculoskeletal: Negative for back pain. Skin: Negative for rash. Neurological: Negative for headaches, focal weakness or numbness.   ____________________________________________   PHYSICAL EXAM:  VITAL SIGNS: ED Triage Vitals  Enc Vitals Group     BP 08/08/20 0833 (!) 172/106     Pulse Rate 08/08/20 0833 76     Resp 08/08/20 0833 18     Temp 08/08/20 0833 98 F (36.7 C)     Temp src --      SpO2 08/08/20 0833 100 %     Weight --      Height --      Head Circumference --      Peak Flow --      Pain Score 08/08/20 0832 10     Pain Loc --      Pain Edu? --      Excl. in GC? --    Constitutional: Alert and oriented. Well appearing and in no acute distress. Eyes: Conjunctivae are normal. PERRL. EOMI. Head: Atraumatic. Nose: No congestion/rhinnorhea. Mouth/Throat: Mucous membranes are moist.  Oropharynx non-erythematous.  Dental caries at tooth #2-4. Hematological/Lymphatic/Immunilogical: No cervical lymphadenopathy. Cardiovascular: Normal rate,  regular rhythm. Grossly normal heart sounds.  Good peripheral circulation.  Elevated blood pressure. Respiratory: Normal respiratory effort.  No retractions. Lungs CTAB. ____________________________________________  RADIOLOGY I, Joni Reining, personally viewed and evaluated these images (plain radiographs) as part of my medical decision making, as well as reviewing the written report by the radiologist.  ED MD interpretation: Official radiology report(s): No results found.  ____________________________________________   PROCEDURES  Procedure(s) performed (including Critical  Care):  Procedures   ____________________________________________   INITIAL IMPRESSION / ASSESSMENT AND PLAN / ED COURSE  As part of my medical decision making, I reviewed the following data within the electronic MEDICAL RECORD NUMBER         Dental pain secondary to devitalized teeth.  Patient given discharge care instruction advised to follow-up from list of dental clinics given discharge care instructions.  Take medication as directed.     ____________________________________________   FINAL CLINICAL IMPRESSION(S) / ED DIAGNOSES  Final diagnoses:  Pain, dental     ED Discharge Orders         Ordered    oxyCODONE (OXY IR/ROXICODONE) 5 MG immediate release tablet  Every 6 hours PRN        08/08/20 0909    amoxicillin (AMOXIL) 500 MG capsule  3 times daily        08/08/20 0909    lidocaine (XYLOCAINE) 2 % solution  Every 6 hours PRN        08/08/20 0909    ibuprofen (ADVIL) 800 MG tablet  Every 8 hours PRN        08/08/20 4765          *Please note:  Charlis Harner was evaluated in Emergency Department on 08/08/2020 for the symptoms described in the history of present illness. He was evaluated in the context of the global COVID-19 pandemic, which necessitated consideration that the patient might be at risk for infection with the SARS-CoV-2 virus that causes COVID-19. Institutional protocols and algorithms that pertain to the evaluation of patients at risk for COVID-19 are in a state of rapid change based on information released by regulatory bodies including the CDC and federal and state organizations. These policies and algorithms were followed during the patient's care in the ED.  Some ED evaluations and interventions may be delayed as a result of limited staffing during and the pandemic.*   Note:  This document was prepared using Dragon voice recognition software and may include unintentional dictation errors.    Joni Reining, PA-C 08/08/20 4650    Willy Eddy, MD 08/08/20 1515

## 2020-08-08 NOTE — Discharge Instructions (Addendum)
Follow discharge care instructions.  Take medication as directed.  Follow-up from list of dental clinic providing discharge care instructions.  OPTIONS FOR DENTAL FOLLOW UP CARE  [place] Department of Health and Human Services - Local Safety Net Dental Clinics [email or web address]   [facility] [phone])  Sharl Ma [phone])  [place] [phone] ext [phone])  [facility] [phone])  [facility] [phone]) This clinic caters to the indigent population and is on a lottery system. Location: [employer] [facility], [employer], [address], [place] Clinic Hours: Wednesdays from 6pm - 9pm, patients seen by a lottery system. For dates, call or go to [email or web address] Services: Cleanings, fillings and simple extractions. Payment Options: DENTAL WORK IS FREE OF CHARGE. Bring proof of income or support. Best way to get seen: Arrive at 5:15 pm - this is a lottery, NOT first come/first serve, so arriving earlier will not increase your chances of being seen.     [facility] [phone] Select option 1 for emergencies   Location: [facility], [place], [address], [place] Clinic Hours: No walk-ins accepted - call the day before to schedule an appointment. Check in times are 9:30 am and 1:30 pm. Services: Simple extractions, temporary fillings, pulpectomy/pulp debridement, uncomplicated abscess drainage. Payment Options: PAYMENT IS DUE AT THE TIME OF SERVICE.  Fee is usually $100-200, additional surgical procedures (e.g. abscess drainage) may be extra. Cash, checks, Visa/MasterCard accepted.  Can file Medicaid if patient is covered for dental - patient should call case worker to check. No discount for [facility] patients. Best way to get seen: MUST call the day before and  get onto the schedule. Can usually be seen the next 1-2 days. No walk-ins accepted.     [facility] [phone]   Location: [facility], [address] [place] Clinic Hours: M, W, Th, F 8am or 1:30pm, Tues 9a or 1:30 - first come/first served. Services: Simple extractions, temporary fillings, uncomplicated abscess drainage.  You do not need to be an [facility] resident. Payment Options: PAYMENT IS DUE AT THE TIME OF SERVICE. Dental insurance, otherwise sliding scale - bring proof of income or support. Depending on income and treatment needed, cost is usually $50-200. Best way to get seen: Arrive early as it is first come/first served.     [facility] [phone]   Location: [address] Clinic Hours: Mon-Thu 8a-5p Services: Most basic dental services including extractions and fillings. Payment Options: PAYMENT IS DUE AT THE TIME OF SERVICE. Sliding scale, up to 50% off - bring proof if income or support. Medicaid with dental option accepted. Best way to get seen: Call to schedule an appointment, can usually be seen within 2 weeks OR they will try to see walk-ins - show up at 8a or 2p (you may have to wait).     [facility] [phone] [place] RESIDENTS ONLY   Location: [facility], [address], [place], [place] [address] Clinic Hours: By appointment only. Monday - Thursday 8am-5pm, Friday 8am-12pm Services: Cleanings, fillings, extractions. Payment Options: PAYMENT IS DUE AT THE TIME OF SERVICE. Cash, Visa or MasterCard. Sliding scale - $30 minimum per service. Best way to get seen: Come in to office, complete packet and make an appointment - need proof of income or support monies for each household member and proof of [facility] residence. Usually takes about  a month to get in.     [facility] [phone]   Location: [address] [place] Clinic Hours: Walk-in Urgent Care Dental Services are offered Monday-Friday mornings only. The numbers of emergencies accepted daily is limited to the number of providers available. Maximum 15 - Mondays, Wednesdays & Thursdays Maximum 10 - Tuesdays & Fridays Services: You do not need to be a [facility] resident to be seen for a dental emergency. Emergencies are defined as pain, swelling, abnormal bleeding, or dental trauma. Walkins will receive x-rays if needed. NOTE: Dental cleaning is not an emergency. Payment Options: PAYMENT IS DUE AT THE TIME OF SERVICE. Minimum co-pay is $40.00 for uninsured patients. Minimum co-pay is $3.00 for Medicaid with dental coverage. Dental Insurance is accepted and must be presented at time of visit. Medicare does not cover dental. Forms of payment: Cash, credit card, checks. Best way to get seen: If not previously registered with the clinic, walk-in dental registration begins at 7:15 am and is on a first come/first serve basis. If previously registered with the clinic, call to make an appointment.     [facility] [place] ONLY   Location: [address], [place], [place] Clinic Hours: Mon-Thu 10a-2p Services: Extractions only! Payment Options: FREE (donations accepted) - bring proof of income or support Best way to get seen: Call and schedule an appointment OR come at 8am on the 1st Monday of every month (except for holidays) when it is first come/first served.     [facility] [phone]   Location: [place], [place] Clinic Hours: Friday mornings Services, Payment Options, Best way to get seen: Call for info

## 2020-08-08 NOTE — ED Notes (Signed)
Pt co of dental pain that started a week ago. The pain has increased in intensity in the last two or three days. Pt has taken ibuprofen and it hasn't worked. Left side of face is swollen. Pain radiates from face down into the bottom side of the jaw. States he hasn't been able to eat without pain hurting, cold and hot foods make the pain worse.

## 2020-08-08 NOTE — ED Triage Notes (Signed)
Pt comes via POV from home with c/o left sided dental pain. Pt states this started over a week ago. Pt states pain has gotten worse.  Pt states trouble sleeping.

## 2020-09-19 ENCOUNTER — Other Ambulatory Visit: Payer: Self-pay

## 2020-09-19 ENCOUNTER — Emergency Department
Admission: EM | Admit: 2020-09-19 | Discharge: 2020-09-19 | Disposition: A | Discharge status: Home / Self Care | Payer: Self-pay | Attending: Emergency Medicine | Admitting: Emergency Medicine

## 2020-09-19 DIAGNOSIS — R030 Elevated blood-pressure reading, without diagnosis of hypertension: Secondary | ICD-10-CM | POA: Insufficient documentation

## 2020-09-19 DIAGNOSIS — K0889 Other specified disorders of teeth and supporting structures: Secondary | ICD-10-CM

## 2020-09-19 DIAGNOSIS — K047 Periapical abscess without sinus: Secondary | ICD-10-CM | POA: Insufficient documentation

## 2020-09-19 DIAGNOSIS — Z79899 Other long term (current) drug therapy: Secondary | ICD-10-CM | POA: Insufficient documentation

## 2020-09-19 DIAGNOSIS — F17200 Nicotine dependence, unspecified, uncomplicated: Secondary | ICD-10-CM | POA: Insufficient documentation

## 2020-09-19 MED ORDER — AMOXICILLIN 500 MG PO CAPS: 500.0000 mg | ORAL_CAPSULE | Freq: Three times a day (TID) | ORAL | 0 refills | Status: DC

## 2020-09-19 MED ORDER — LIDOCAINE VISCOUS HCL 2 % MT SOLN
15.0000 mL | Freq: Once | OROMUCOSAL | Status: AC
  Administered 2020-09-19: 15 mL via OROMUCOSAL
  Filled 2020-09-19: qty 15

## 2020-09-19 MED ORDER — AMLODIPINE BESYLATE 5 MG PO TABS: 5.0000 mg | ORAL_TABLET | Freq: Every day | ORAL | 2 refills | Status: DC

## 2020-09-19 MED ORDER — TRAMADOL HCL 50 MG PO TABS: 50.0000 mg | ORAL_TABLET | Freq: Three times a day (TID) | ORAL | 0 refills | Status: AC | PRN

## 2020-09-19 MED ORDER — AMOXICILLIN 500 MG PO CAPS
500.0000 mg | ORAL_CAPSULE | Freq: Once | ORAL | Status: AC
  Administered 2020-09-19: 500 mg via ORAL
  Filled 2020-09-19: qty 1

## 2020-09-19 MED ORDER — TRAMADOL HCL 50 MG PO TABS
50.0000 mg | ORAL_TABLET | Freq: Once | ORAL | Status: AC
Start: 2020-09-19 — End: 2020-09-19
  Administered 2020-09-19: 50 mg via ORAL
  Filled 2020-09-19: qty 1

## 2020-09-19 NOTE — Discharge Instructions (Addendum)
Your exam is consistent with dental pain secondary to your wisdom tooth.  Take antibiotic as directed and pain medicine as needed.  You are being started on a blood pressure medicine, take it as directed. Avoid any anti-inflammatories (ibuprofen, naproxen, aspirin), as these meds can elevate your blood pressure (BP). Follow-up with one of the community clinics for ongoing BP management.  Follow-up with the dental clinic for routine management.  Return as needed.   OPTIONS FOR DENTAL FOLLOW UP CARE  [place] Department of Health and Human Services - Local Safety Net Dental Clinics [email or web address]   [facility] [phone])  Edward Maldonado [phone])  [place] [phone])  [facility] [phone])  [facility] [phone]) This clinic caters to the indigent population and is on a lottery system. Location: [employer] [facility], [employer], [address] [place] Clinic Hours: Wednesdays from 6pm - 9pm, patients seen by a lottery system. For dates, call or go to [email or web address] Services: Cleanings, fillings and simple extractions. Payment Options: DENTAL WORK IS FREE OF CHARGE. Bring proof of income or support. Best way to get seen: Arrive at 5:15 pm - this is a lottery, NOT first come/first serve, so arriving earlier will not increase your chances of being seen.     [facility] [phone] Select option 1 for emergencies   Location: [facility], [place], [address], [place] Clinic Hours: No walk-ins accepted - call the day before to schedule an appointment. Check in times are 9:30 am and 1:30 pm. Services: Simple extractions, temporary fillings, pulpectomy/pulp debridement, uncomplicated abscess drainage. Payment Options: PAYMENT IS DUE AT THE TIME OF SERVICE.   Fee is usually $100-200, additional surgical procedures (e.g. abscess drainage) may be extra. Cash, checks, Visa/MasterCard accepted.  Can file Medicaid if patient is covered for dental - patient should call case worker to check. No discount for [facility] patients. Best way to get seen: MUST call the day before and get onto the schedule. Can usually be seen the next 1-2 days. No walk-ins accepted.     [facility] [phone]   Location: [facility], [address], [place] Clinic Hours: M, W, Th, F 8am or 1:30pm, Tues 9a or 1:30 - first come/first served. Services: Simple extractions, temporary fillings, uncomplicated abscess drainage.  You do not need to be an [facility] resident. Payment Options: PAYMENT IS DUE AT THE TIME OF SERVICE. Dental insurance, otherwise sliding scale - bring proof of income or support. Depending on income and treatment needed, cost is usually $50-200. Best way to get seen: Arrive early as it is first come/first served.     [facility] [phone]   Location: [address] Clinic Hours: Mon-Thu 8a-5p Services: Most basic dental services including extractions and fillings. Payment Options: PAYMENT IS DUE AT THE TIME OF SERVICE. Sliding scale, up to 50% off - bring proof if income or support. Medicaid with dental option accepted. Best way to get seen: Call to schedule an appointment, can usually be seen within 2 weeks OR they will try to see walk-ins - show up at 8a or 2p (you may have to wait).     [facility] [phone] [place] RESIDENTS ONLY   Location: [facility], [address], [place], [place] [address] Clinic Hours: By appointment only. Monday - Thursday 8am-5pm, Friday 8am-12pm Services: Cleanings, fillings, extractions. Payment  Options: PAYMENT IS DUE AT THE TIME OF SERVICE. Cash, Visa or  MasterCard. Sliding scale - $30 minimum per service. Best way to get seen: Come in to office, complete packet and make an appointment - need proof of income or support monies for each household member and proof of [facility] residence. Usually takes about a month to get in.     [facility] [phone]   Location: [address], [facility] Clinic Hours: Walk-in Urgent Care Dental Services are offered Monday-Friday mornings only. The numbers of emergencies accepted daily is limited to the number of providers available. Maximum 15 - Mondays, Wednesdays & Thursdays Maximum 10 - Tuesdays & Fridays Services: You do not need to be a [facility] resident to be seen for a dental emergency. Emergencies are defined as pain, swelling, abnormal bleeding, or dental trauma. Walkins will receive x-rays if needed. NOTE: Dental cleaning is not an emergency. Payment Options: PAYMENT IS DUE AT THE TIME OF SERVICE. Minimum co-pay is $40.00 for uninsured patients. Minimum co-pay is $3.00 for Medicaid with dental coverage. Dental Insurance is accepted and must be presented at time of visit. Medicare does not cover dental. Forms of payment: Cash, credit card, checks. Best way to get seen: If not previously registered with the clinic, walk-in dental registration begins at 7:15 am and is on a first come/first serve basis. If previously registered with the clinic, call to make an appointment.     [facility] [phone] [place] RESIDENTS ONLY   Location: [address] [place], [place] Clinic Hours: Mon-Thu 10a-2p Services: Extractions only! Payment Options: FREE (donations accepted) - bring proof of income or support Best way to get seen: Call and schedule an appointment OR come at 8am on the 1st Monday of every month (except for holidays) when it is first come/first served.     [facility] [phone]   Location: [address] [place],  [place] Clinic Hours: Friday mornings Services, Payment Options, Best way to get seen: Call for info

## 2020-09-19 NOTE — ED Provider Notes (Signed)
[facility] Emergency Department Provider Note ____________________________________________  Time seen: 1409  I have reviewed the triage vital signs and the nursing notes.  HISTORY  Chief Complaint  Dental Pain   HPI Edward Maldonado is a 35 y.o. male presents with pain to the left lower jaw at the 3rd molar. He denies fevers, chills, gum swelling, or purulent drainage. He notes the molar teeth seem to touch first, when he closes his mouth. He denies any difficulty with breathing, swallowing, or controlling oral secretions. 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   History reviewed. No pertinent past medical history.  There are no problems to display for this patient.  History reviewed. No pertinent surgical history.  Prior to Admission medications   Medication Sig Start Date End Date Taking? Authorizing Provider  amLODipine (NORVASC) 5 MG tablet Take 1 tablet (5 mg total) by mouth daily. 09/19/20 12/18/20 Yes Jeffie Widdowson, Charlesetta Ivory, PA-C  amoxicillin (AMOXIL) 500 MG capsule Take 1 capsule (500 mg total) by mouth 3 (three) times daily. 09/19/20  Yes Alvaretta Eisenberger, Charlesetta Ivory, PA-C  traMADol (ULTRAM) 50 MG tablet Take 1 tablet (50 mg total) by mouth 3 (three) times daily as needed for up to 5 days. 09/19/20 09/24/20 Yes Hanny Elsberry, Charlesetta Ivory, PA-C    Allergies Patient has no known allergies.  History reviewed. No pertinent family history.  Social History Social History   Tobacco Use  . Smoking status: Current Every Day Smoker  . Smokeless tobacco: Never Used  Substance Use Topics  . Alcohol use: Never  . Drug use: Never    Review of Systems  Constitutional: Negative for fever. Eyes: Negative for visual changes. ENT: Negative for sore throat. Dental pain as above Cardiovascular: Negative for chest pain. Respiratory: Negative for shortness of breath. Gastrointestinal: Negative for abdominal pain, vomiting and diarrhea. Genitourinary:  Negative for dysuria. Musculoskeletal: Negative for back pain. Skin: Negative for rash. Neurological: Negative for headaches, focal weakness or numbness. ____________________________________________  PHYSICAL EXAM:  VITAL SIGNS: ED Triage Vitals  Enc Vitals Group     BP 09/19/20 1347 (!) 192/123     Pulse Rate 09/19/20 1347 87     Resp 09/19/20 1347 18     Temp 09/19/20 1347 98.3 F (36.8 C)     Temp Source 09/19/20 1347 Oral     SpO2 09/19/20 1347 96 %     Weight 09/19/20 1347 215 lb (97.5 kg)     Height 09/19/20 1347 5\' 11"  (1.803 m)     Head Circumference --      Peak Flow --      Pain Score 09/19/20 1337 10     Pain Loc --      Pain Edu? --      Excl. in GC? --     Constitutional: Alert and oriented. Well appearing and in no distress. Head: Normocephalic and atraumatic. Eyes: Conjunctivae are normal. PERRL. Normal extraocular movements Mouth/Throat: Mucous membranes are moist. Uvula is midline and tonsils are flat. No oral lesions noted. LL 3rd molar with a chronic posterior fracture. No focal gum swelling. No sublingual edema or brawny erythema.  Neck: Supple. No thyromegaly. Hematological/Lymphatic/Immunological: No cervical lymphadenopathy. Cardiovascular: Normal rate, regular rhythm. Normal distal pulses. Respiratory: Normal respiratory effort. No wheezes/rales/rhonchi. Gastrointestinal: Soft and nontender. No distention. Musculoskeletal: Nontender with normal range of motion in all extremities.  Neurologic:  Normal gait without ataxia. Normal speech and language. No gross focal neurologic deficits are appreciated. Skin:  Skin is warm, dry and intact. No rash noted. ____________________________________________  PROCEDURES  Amoxicillin 500 mg PO Ultram 50 mg PO Viscous lido 2% gargle  Procedures ____________________________________________  INITIAL IMPRESSION / ASSESSMENT AND PLAN / ED COURSE  DDX: dental caries, dental fracture, pericoronitis, dental  infection  Patient with ED evaluation of LL dental pain. No indication of airway compromise. No focal gum swelling or pointing. He will be treated with amoxicillin and Ultram. He is referred to any number of local dental providers. He was also found to have significantly elevated BP, and it is noted on all previous visits. He does not have a diagnosis of HTN, he denies chest pain or dyspnea.  No indication of renal disease on previous labs. He will be started on amlodipine and referred to a local community clinic for further management. return precautions are provided.   Edward Maldonado was evaluated in Emergency Department on 09/19/2020 for the symptoms described in the history of present illness. He was evaluated in the context of the global COVID-19 pandemic, which necessitated consideration that the patient might be at risk for infection with the SARS-CoV-2 virus that causes COVID-19. Institutional protocols and algorithms that pertain to the evaluation of patients at risk for COVID-19 are in a state of rapid change based on information released by regulatory bodies including the CDC and federal and state organizations. These policies and algorithms were followed during the patient's care in the ED.  I reviewed the patient's prescription history over the last 12 months in the multi-state controlled substances database(s) that includes [place], [place], [place], [place], [place], [place], [place], [place], [place], [place], [place], [place], [place], and [place].  Results were notable for no current RX.  ____________________________________________  FINAL CLINICAL IMPRESSION(S) / ED DIAGNOSES  Final diagnoses:  Pain, dental  Dental infection  Elevated blood-pressure reading without diagnosis of hypertension      Karmen Stabs, Charlesetta Ivory, PA-C 09/19/20 1439    Sharman Cheek, MD 09/21/20 0007

## 2020-09-19 NOTE — ED Triage Notes (Signed)
Pt c/o left lower dental pain for the past 2 days

## 2020-10-21 ENCOUNTER — Encounter: Payer: Self-pay | Admitting: Emergency Medicine

## 2020-10-21 ENCOUNTER — Emergency Department: Payer: Self-pay

## 2020-10-21 ENCOUNTER — Emergency Department
Admission: EM | Admit: 2020-10-21 | Discharge: 2020-10-21 | Disposition: A | Discharge status: Home / Self Care | Payer: Self-pay | Attending: Emergency Medicine | Admitting: Emergency Medicine

## 2020-10-21 ENCOUNTER — Other Ambulatory Visit: Payer: Self-pay

## 2020-10-21 DIAGNOSIS — M7918 Myalgia, other site: Secondary | ICD-10-CM | POA: Diagnosis not present

## 2020-10-21 DIAGNOSIS — S20319A Abrasion of unspecified front wall of thorax, initial encounter: Secondary | ICD-10-CM | POA: Insufficient documentation

## 2020-10-21 DIAGNOSIS — Y92410 Unspecified street and highway as the place of occurrence of the external cause: Secondary | ICD-10-CM | POA: Insufficient documentation

## 2020-10-21 DIAGNOSIS — S40811A Abrasion of right upper arm, initial encounter: Secondary | ICD-10-CM | POA: Diagnosis not present

## 2020-10-21 DIAGNOSIS — S0001XA Abrasion of scalp, initial encounter: Secondary | ICD-10-CM | POA: Diagnosis not present

## 2020-10-21 DIAGNOSIS — R519 Headache, unspecified: Secondary | ICD-10-CM | POA: Diagnosis not present

## 2020-10-21 DIAGNOSIS — S39012A Strain of muscle, fascia and tendon of lower back, initial encounter: Secondary | ICD-10-CM | POA: Insufficient documentation

## 2020-10-21 DIAGNOSIS — S199XXA Unspecified injury of neck, initial encounter: Secondary | ICD-10-CM | POA: Diagnosis present

## 2020-10-21 DIAGNOSIS — S161XXA Strain of muscle, fascia and tendon at neck level, initial encounter: Secondary | ICD-10-CM | POA: Insufficient documentation

## 2020-10-21 DIAGNOSIS — S40812A Abrasion of left upper arm, initial encounter: Secondary | ICD-10-CM | POA: Insufficient documentation

## 2020-10-21 DIAGNOSIS — Z008 Encounter for other general examination: Secondary | ICD-10-CM

## 2020-10-21 DIAGNOSIS — F17200 Nicotine dependence, unspecified, uncomplicated: Secondary | ICD-10-CM | POA: Insufficient documentation

## 2020-10-21 DIAGNOSIS — X58XXXA Exposure to other specified factors, initial encounter: Secondary | ICD-10-CM | POA: Insufficient documentation

## 2020-10-21 DIAGNOSIS — T07XXXA Unspecified multiple injuries, initial encounter: Secondary | ICD-10-CM

## 2020-10-21 MED ORDER — ORPHENADRINE CITRATE 30 MG/ML IJ SOLN
60.0000 mg | Freq: Two times a day (BID) | INTRAMUSCULAR | Status: DC
  Administered 2020-10-21: 60 mg via INTRAMUSCULAR
  Filled 2020-10-21: qty 2

## 2020-10-21 MED ORDER — NAPROXEN 500 MG PO TABS: 500.0000 mg | ORAL_TABLET | Freq: Two times a day (BID) | ORAL | 0 refills | Status: DC

## 2020-10-21 MED ORDER — ORPHENADRINE CITRATE ER 100 MG PO TB12: 100.0000 mg | ORAL_TABLET | Freq: Two times a day (BID) | ORAL | 0 refills | Status: DC

## 2020-10-21 MED ORDER — HYDROMORPHONE HCL 1 MG/ML IJ SOLN
1.0000 mg | Freq: Once | INTRAMUSCULAR | Status: AC
  Administered 2020-10-21: 1 mg via INTRAMUSCULAR
  Filled 2020-10-21: qty 1

## 2020-10-21 MED ORDER — OXYCODONE HCL 5 MG PO TABS: 5.0000 mg | ORAL_TABLET | Freq: Four times a day (QID) | ORAL | 0 refills | Status: AC | PRN
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
## 2020-10-21 NOTE — Discharge Instructions (Addendum)
Return to the ER for persistent vomiting, difficulty breathing or other concerns.
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
## 2020-10-21 NOTE — Discharge Instructions (Signed)
No acute findings on CT of the head, neck, chest.  Read and follow discharge care instruction take medication as directed

## 2020-10-21 NOTE — ED Notes (Addendum)
Pt requested arm be cleaned from dried blood. Pt arms wiped down and wounds clean. Adhesive bandages placed to R and L elbow abrasions.   Discharge instructions provided to pt at this time.
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
## 2020-10-21 NOTE — ED Provider Notes (Signed)
[facility] Emergency Department Provider Note   ____________________________________________   Event Date/Time   First MD Initiated Contact with Patient 10/21/20 [phone]     (approximate)  I have reviewed the triage vital signs and the nursing notes.   HISTORY  Chief Complaint Medical Clearance    HPI Edward Maldonado is a 35 y.o. male brought to the ED by [employer] for medical clearance for jail.  Patient with abrasions to upper extremities and scalp.  Declines further evaluation or intervention including vital signs.  Refuses to consent to blood draw until he sees the warrants.  Tells me his tetanus is up-to-date and that he did not suffer LOC tonight.     Past medical history None  There are no problems to display for this patient.   History reviewed. No pertinent surgical history.  Prior to Admission medications   Medication Sig Start Date End Date Taking? Authorizing Provider  amLODipine (NORVASC) 5 MG tablet Take 1 tablet (5 mg total) by mouth daily. 09/19/20 12/18/20  Menshew, Charlesetta Ivory, PA-C  amoxicillin (AMOXIL) 500 MG capsule Take 1 capsule (500 mg total) by mouth 3 (three) times daily. 09/19/20   Menshew, Charlesetta Ivory, PA-C    Allergies Patient has no known allergies.  No family history on file.  Social History Social History   Tobacco Use  . Smoking status: Current Every Day Smoker  . Smokeless tobacco: Never Used  Substance Use Topics  . Alcohol use: Never  . Drug use: Never    Review of Systems  Constitutional: No fever/chills Eyes: No visual changes. ENT: No sore throat. Cardiovascular: Denies chest pain. Respiratory: Denies shortness of breath. Gastrointestinal: No abdominal pain.  No nausea, no vomiting.  No diarrhea.  No constipation. Genitourinary: Negative for dysuria. Musculoskeletal: Negative for back pain. Skin: Positive for abrasions.  Negative for rash. Neurological: Negative for headaches, focal  weakness or numbness.   ____________________________________________   PHYSICAL EXAM:  VITAL SIGNS: ED Triage Vitals [10/21/20 0522]  Enc Vitals Group     BP      Pulse      Resp      Temp      Temp src      SpO2      Weight 215 lb (97.5 kg)     Height 5\' 11"  (1.803 m)     Head Circumference      Peak Flow      Pain Score 0     Pain Loc      Pain Edu?      Excl. in GC?     Constitutional: Alert and oriented. Well appearing and in no acute distress. Eyes: Conjunctivae are normal. PERRL. EOMI. Head: Minor abrasions. Nose: Atraumatic. Mouth/Throat: Mucous membranes are moist.  No dental malocclusion. Neck: No stridor.   Cardiovascular: No cyanosis. Respiratory: Normal respiratory effort.  No retractions.  Equal rise and fall. Gastrointestinal: Soft without distention.  Musculoskeletal: No lower extremity tenderness nor edema.  No joint effusions. Neurologic:  Normal speech and language. No gross focal neurologic deficits are appreciated. No gait instability. Skin:  Skin is warm, dry and intact. No rash noted.  Minor abrasions to upper extremities. Psychiatric: Mood and affect are normal. Speech and behavior are normal.  ____________________________________________   LABS (all labs ordered are listed, but only abnormal results are displayed)  Labs Reviewed - No data to display ____________________________________________  EKG  None ____________________________________________  RADIOLOGY I, Grasiela Jonsson J, personally viewed and evaluated these  images (plain radiographs) as part of my medical decision making, as well as reviewing the written report by the radiologist.  ED MD interpretation: None  Official radiology report(s): No results found.  ____________________________________________   PROCEDURES  Procedure(s) performed (including Critical Care):  Procedures   ____________________________________________   INITIAL IMPRESSION / ASSESSMENT AND PLAN  / ED COURSE  As part of my medical decision making, I reviewed the following data within the electronic MEDICAL RECORD NUMBER Nursing notes reviewed and incorporated, Old chart reviewed and Notes from prior ED visits     36 year old male who presents for medical clearance for jail.  Does not allow examination by auscultation or vital signs.  Politely declines offer to clean his abrasions.  Strict return precautions given.  Patient verbalizes understanding agrees with plan of care.      ____________________________________________   FINAL CLINICAL IMPRESSION(S) / ED DIAGNOSES  Final diagnoses:  Medical clearance for incarceration  Abrasions of multiple sites     ED Discharge Orders    None      *Please note:  Edward Maldonado was evaluated in Emergency Department on 10/21/2020 for the symptoms described in the history of present illness. He was evaluated in the context of the global COVID-19 pandemic, which necessitated consideration that the patient might be at risk for infection with the SARS-CoV-2 virus that causes COVID-19. Institutional protocols and algorithms that pertain to the evaluation of patients at risk for COVID-19 are in a state of rapid change based on information released by regulatory bodies including the CDC and federal and state organizations. These policies and algorithms were followed during the patient's care in the ED.  Some ED evaluations and interventions may be delayed as a result of limited staffing during and the pandemic.*   Note:  This document was prepared using Dragon voice recognition software and may include unintentional dictation errors.   Irean Hong, MD 10/21/20 [phone]

## 2020-10-21 NOTE — ED Triage Notes (Signed)
Pt refused to let me do VS and will not consent to medical clearance or let anyone touch him until he sees the warrant. [employer] at bedside. Also would not let me put on his bracelet.

## 2021-06-16 IMAGING — DX DG HAND COMPLETE 3+V*L*
3 series · 3 of 3 positions shown · non-contrast
Comparison: None.

CLINICAL DATA: Left hand pain after fall.

EXAM:
LEFT HAND - COMPLETE 3+ VIEW

[hand ap]
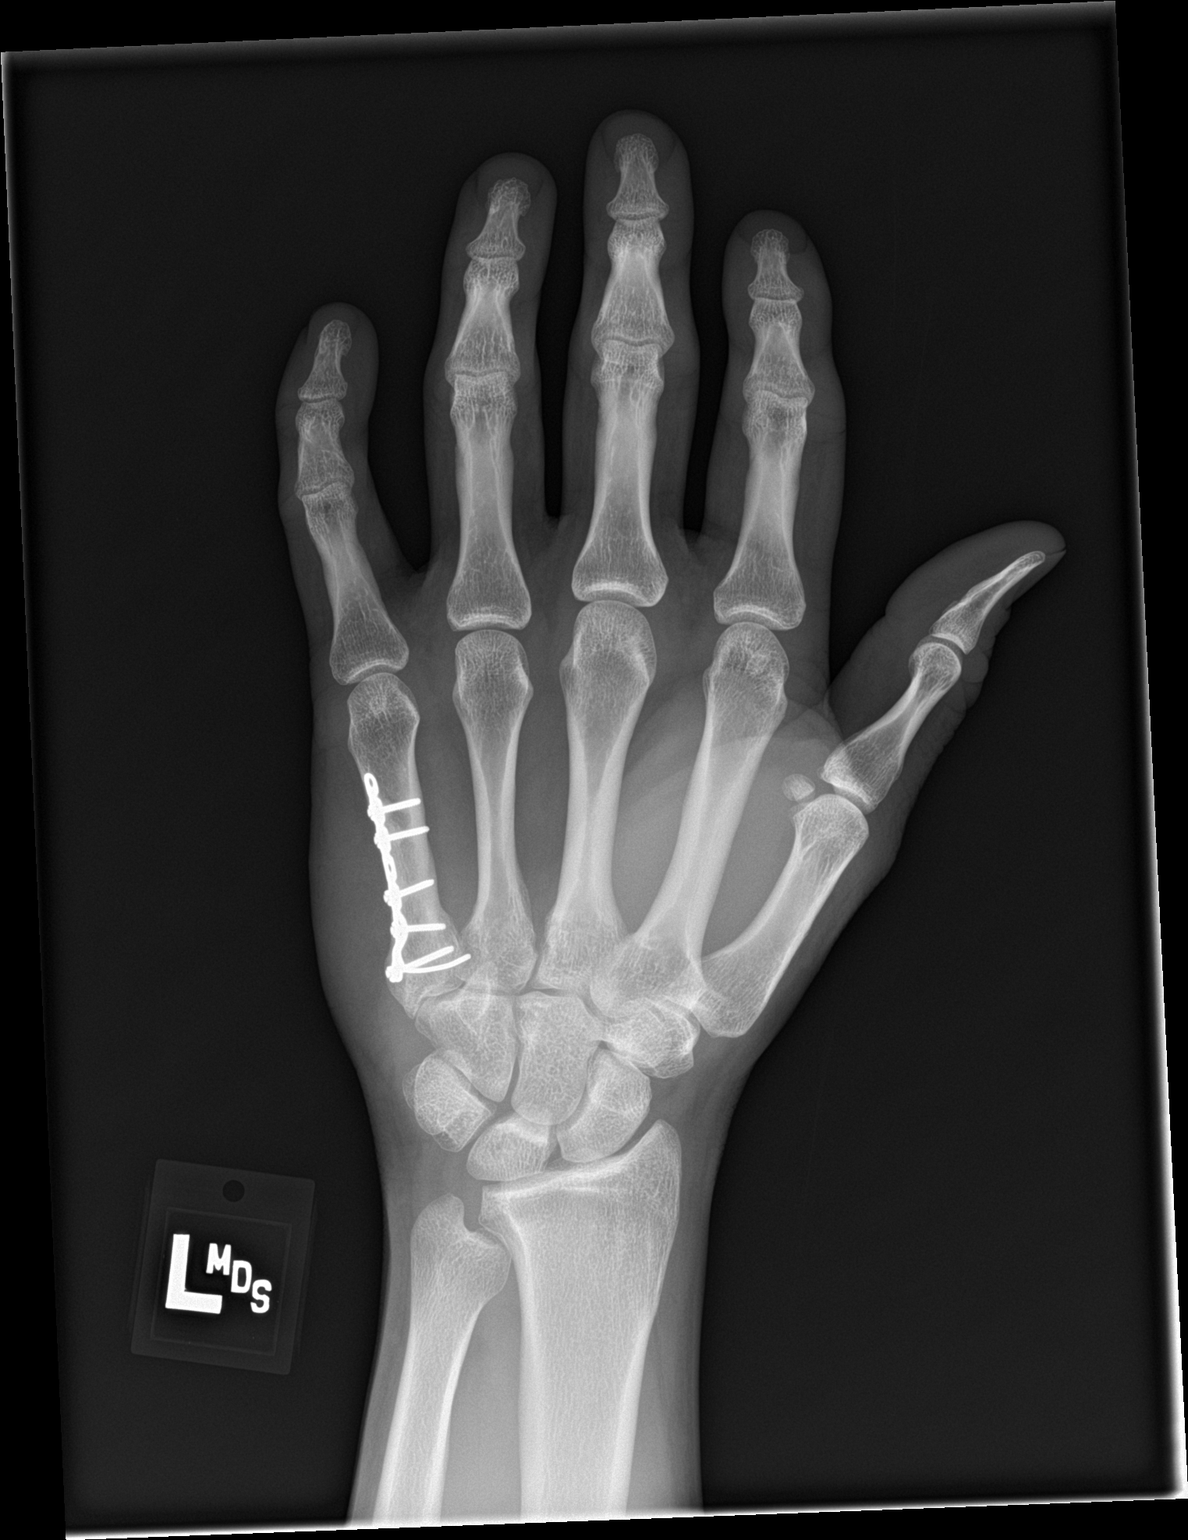

[hand obl]
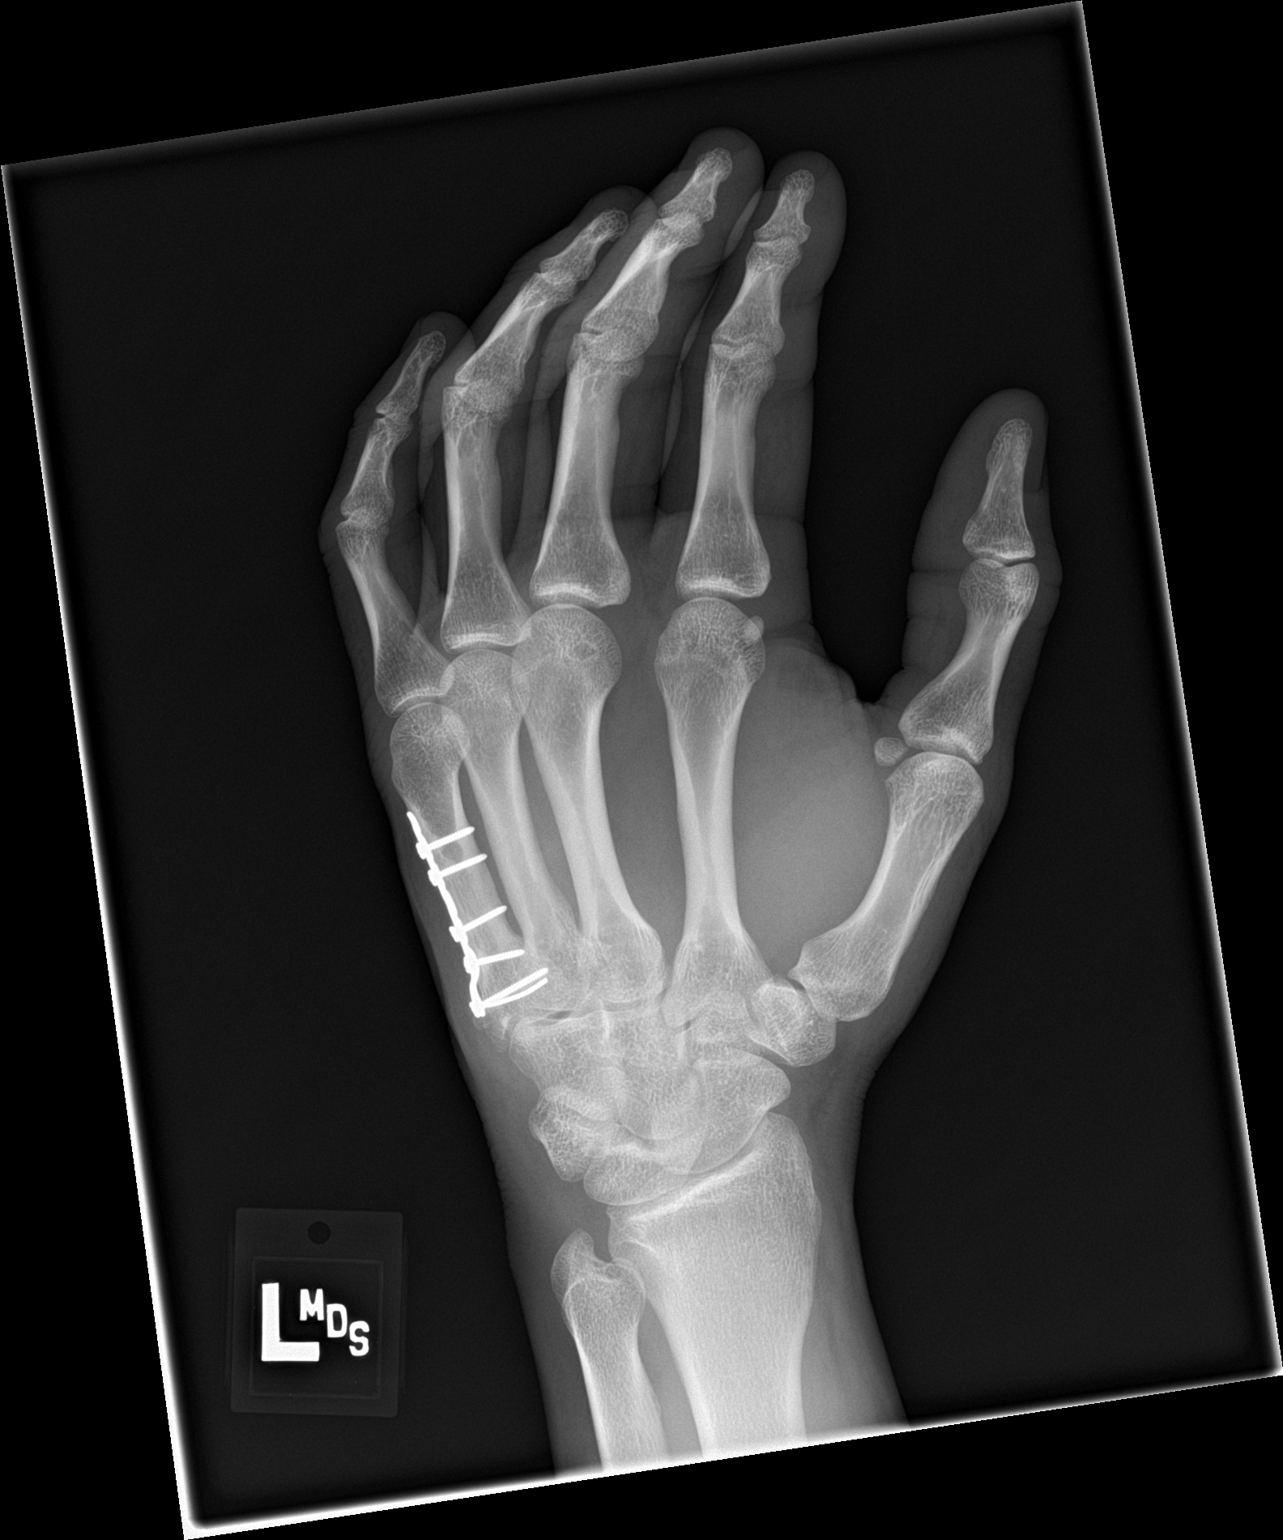

[hand lat]
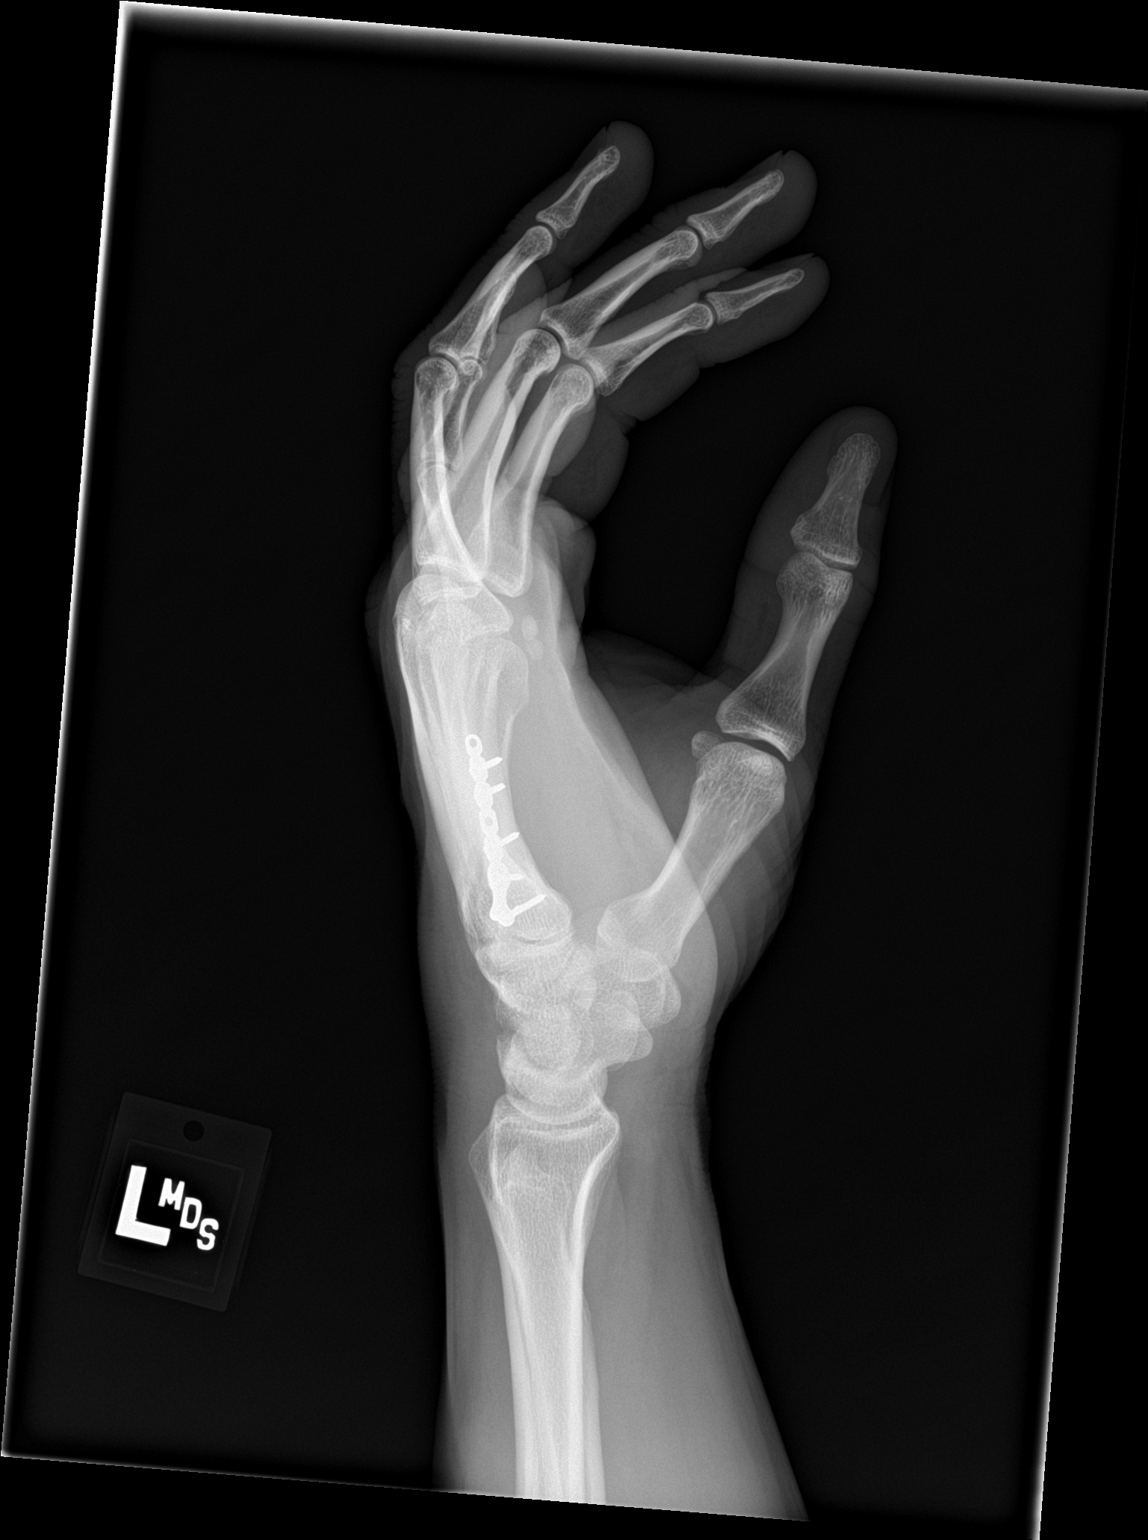

[3 of 3 positions shown; findings below may reference images not displayed]

FINDINGS: There is no evidence of acute fracture or dislocation. There is no
evidence of arthropathy. Status post surgical repair of old fifth
metacarpal fracture. Soft tissues are unremarkable.
IMPRESSION: No acute abnormality seen in the left hand.

## 2021-08-06 ENCOUNTER — Other Ambulatory Visit: Payer: Self-pay

## 2021-08-06 ENCOUNTER — Emergency Department: Payer: BC Managed Care – PPO

## 2021-08-06 ENCOUNTER — Emergency Department
Admission: EM | Admit: 2021-08-06 | Discharge: 2021-08-06 | Disposition: A | Discharge status: Home / Self Care | Payer: BC Managed Care – PPO | Attending: Emergency Medicine | Admitting: Emergency Medicine

## 2021-08-06 DIAGNOSIS — F17210 Nicotine dependence, cigarettes, uncomplicated: Secondary | ICD-10-CM | POA: Diagnosis not present

## 2021-08-06 DIAGNOSIS — Z91199 Patient's noncompliance with other medical treatment and regimen due to unspecified reason: Secondary | ICD-10-CM | POA: Diagnosis not present

## 2021-08-06 DIAGNOSIS — J069 Acute upper respiratory infection, unspecified: Secondary | ICD-10-CM | POA: Diagnosis not present

## 2021-08-06 DIAGNOSIS — Z20822 Contact with and (suspected) exposure to covid-19: Secondary | ICD-10-CM | POA: Insufficient documentation

## 2021-08-06 DIAGNOSIS — I10 Essential (primary) hypertension: Secondary | ICD-10-CM | POA: Diagnosis not present

## 2021-08-06 DIAGNOSIS — R059 Cough, unspecified: Secondary | ICD-10-CM | POA: Diagnosis present

## 2021-08-06 LAB — RESP PANEL BY RT-PCR (FLU A&B, COVID) ARPGX2
Influenza A by PCR: NEGATIVE
Influenza B by PCR: NEGATIVE
SARS Coronavirus 2 by RT PCR: NEGATIVE

## 2021-08-06 MED ORDER — AMLODIPINE BESYLATE 5 MG PO TABS: 5.0000 mg | ORAL_TABLET | Freq: Every day | ORAL | 2 refills | Status: DC

## 2021-08-06 MED ORDER — ALBUTEROL SULFATE HFA 108 (90 BASE) MCG/ACT IN AERS: 2.0000 | INHALATION_SPRAY | Freq: Four times a day (QID) | RESPIRATORY_TRACT | 2 refills | Status: AC | PRN

## 2021-08-06 MED ORDER — IPRATROPIUM-ALBUTEROL 0.5-2.5 (3) MG/3ML IN SOLN
3.0000 mL | Freq: Once | RESPIRATORY_TRACT | Status: AC
  Administered 2021-08-06: 3 mL via RESPIRATORY_TRACT
  Filled 2021-08-06: qty 3

## 2021-08-06 NOTE — ED Triage Notes (Signed)
Pt c/o cough with congestion for the past couple of days and today feeling SOB. Pt is in NAD on arrival

## 2021-08-06 NOTE — Discharge Instructions (Signed)
Follow-up with your primary care provider and if unable to get an appointment soon you should be [facility] to have your blood pressure rechecked.  A prescription for Norvasc was sent to the pharmacy to help control your blood pressure however it is extremely important that you stay on your blood pressure medication and get follow-up for continued control.  This will reduce your chances of a heart attack or stroke.  An albuterol inhaler was sent to the pharmacy to use when you are short of breath or wheezing.  Tried to discontinue or decrease the amount of smoking that you are doing which will help with this.

## 2021-08-06 NOTE — ED Provider Notes (Signed)
[facility] Provider Note    Event Date/Time   First MD Initiated Contact with Patient 08/06/21 0831     (approximate)   History   URI   HPI  Edward Maldonado is a 36 y.o. male Edward Maldonado to the ED with complaint of cough and congestion for several days.  Patient states that this morning he was walking and had a feeling of shortness of breath.  Patient denies any fever, chills, nausea or vomiting.  Patient denies any medical problems but records indicate that he does have a history of hypertension and has been on Norvasc in the past.  Patient states that he got 1 Moderna shot but did not return for the second.  Patient smokes 1 pack cigarettes per day.  He denies any other health problems.  He states that he is frequently on and off his medication as he does not have a PCP but now has insurance and plans to make arrangements for continued care.     Physical Exam   Triage Vital Signs: ED Triage Vitals [08/06/21 0809]  Enc Vitals Group     BP (!) 173/106     Pulse Rate 77     Resp 18     Temp 97.8 F (36.6 C)     Temp Source Oral     SpO2 95 %     Weight 215 lb (97.5 kg)     Height 5\' 11"  (1.803 m)     Head Circumference      Peak Flow      Pain Score      Pain Loc      Pain Edu?      Excl. in GC?     Most recent vital signs: Vitals:   08/06/21 0809 08/06/21 1023  BP: (!) 173/106 (!) 152/95  Pulse: 77 75  Resp: 18 18  Temp: 97.8 F (36.6 C)   SpO2: 95% 100%     General: Awake, no distress.  CV:  Good peripheral perfusion.  Heart regular rate and rhythm without murmur. Resp:  Normal effort.  Lungs are noted to have a faint expiratory wheeze on the right.  No cough was noted during the exam.  No rales or rhonchi. Abd:  No distention.  Other:  Ambulatory without any assistance.   ED Results / Procedures / Treatments   Labs (all labs ordered are listed, but only abnormal results are displayed) Labs Reviewed  RESP PANEL BY RT-PCR (FLU  A&B, COVID) [id number]      RADIOLOGY 2 view chest x-ray images were reviewed by me and no acute changes were noted.  Radiology report was read.  No acute cardiopulmonary disease present.    PROCEDURES:  Critical Care performed:   Procedures   MEDICATIONS ORDERED IN ED: Medications  ipratropium-albuterol (DUONEB) 0.5-2.5 (3) MG/3ML nebulizer solution 3 mL (3 mLs Nebulization Given 08/06/21 0938)     IMPRESSION / MDM / ASSESSMENT AND PLAN / ED COURSE  I reviewed the triage vital signs and the nursing notes.   Differential diagnosis includes, but is not limited to, COVID, influenza, bronchitis, viral URI, pneumonia.  36 year old male presents to the ED with concerns of upper respiratory infection for several days with cough but not aware of any fever.  Patient reports that he got 1 injection of the Moderna COVID-vaccine did not the second.  He continues to smoke 1 pack cigarettes per day.  This morning he felt as if his congestion and cough was worse  and had some shortness of breath.  He has also noticed a wheeze.  Chest x-ray was negative along with COVID and influenza.  Patient was reassured.  After being given a DuoNeb treatment there was less wheezing and patient reports that he is breathing much easier.  We discussed decreasing the amount of smoking that he has been doing and the importance of controlling his blood pressure.  A prescription for Norvasc 5 mg 1 daily and an albuterol inhaler was sent to his pharmacy.  He is to arrange for a primary care provider since he now has insurance but was made aware until that time he can follow-up with [facility] which would be free.   FINAL CLINICAL IMPRESSION(S) / ED DIAGNOSES   Final diagnoses:  Viral URI with cough  Tobacco smoker, 1 pack of cigarettes or less per day  Poorly-controlled hypertension  Medically noncompliant     Rx / DC Orders   ED Discharge Orders          Ordered    amLODipine (NORVASC) 5 MG tablet   Daily        08/06/21 1013    albuterol (VENTOLIN HFA) 108 (90 Base) MCG/ACT inhaler  Every 6 hours PRN        08/06/21 1013             Note:  This document was prepared using Dragon voice recognition software and may include unintentional dictation errors.   Tommi Rumps, PA-C 08/06/21 1029    Arnaldo Natal, MD 08/06/21 [phone]

## 2021-08-06 NOTE — ED Notes (Signed)
Patient discharged to home per MD order. Patient in stable condition, and deemed medically cleared by ED provider for discharge. Discharge instructions reviewed with patient/family using "Teach Back"; verbalized understanding of medication education and administration, and information about follow-up care. Denies further concerns. ° °

## 2021-08-29 ENCOUNTER — Emergency Department
Admission: EM | Admit: 2021-08-29 | Discharge: 2021-08-29 | Disposition: A | Discharge status: Home / Self Care | Payer: BC Managed Care – PPO | Attending: Emergency Medicine | Admitting: Emergency Medicine

## 2021-08-29 ENCOUNTER — Encounter: Payer: Self-pay | Admitting: Emergency Medicine

## 2021-08-29 ENCOUNTER — Other Ambulatory Visit: Payer: Self-pay

## 2021-08-29 DIAGNOSIS — K0889 Other specified disorders of teeth and supporting structures: Secondary | ICD-10-CM

## 2021-08-29 DIAGNOSIS — K047 Periapical abscess without sinus: Secondary | ICD-10-CM | POA: Diagnosis not present

## 2021-08-29 DIAGNOSIS — I10 Essential (primary) hypertension: Secondary | ICD-10-CM | POA: Insufficient documentation

## 2021-08-29 HISTORY — DX: Essential (primary) hypertension: I10

## 2021-08-29 MED ORDER — HYDROCODONE-ACETAMINOPHEN 5-325 MG PO TABS: 1.0000 | ORAL_TABLET | Freq: Three times a day (TID) | ORAL | 0 refills | Status: AC | PRN

## 2021-08-29 MED ORDER — AMOXICILLIN 500 MG PO CAPS: 500.0000 mg | ORAL_CAPSULE | Freq: Three times a day (TID) | ORAL | 0 refills | Status: AC

## 2021-08-29 MED ORDER — TRAMADOL HCL 50 MG PO TABS: 50.0000 mg | ORAL_TABLET | Freq: Two times a day (BID) | ORAL | 0 refills | Status: AC

## 2021-08-29 NOTE — Discharge Instructions (Signed)
Take the antibiotic as directed. Take the pain medicine as needed. Follow-up with one of the local dental clinics for routine care.  ? ?OPTIONS FOR DENTAL FOLLOW UP CARE ? ? Department of Health and Human Services - Local Safety Net Dental Clinics ?[email or web address] ?  ?[facility] [phone]) ? ?[facility] [phone]) ? ?[place] [phone]) ? ?[facility] [phone]) ? ?[facility] [phone]) ?This clinic caters to the indigent population and is on a lottery system. ?Location: ?[facility], [employer], [address] [place] ?Clinic Hours: ?Wednesdays from 6pm - 9pm, patients seen by a lottery system. ?For dates, call or go to [email or web address] ?Services: ?Cleanings, fillings and simple extractions. ?Payment Options: ?DENTAL WORK IS FREE OF CHARGE. Bring proof of income or support. ?Best way to get seen: ?Arrive at 5:15 pm - this is a lottery, NOT first come/first serve, so arriving earlier will not increase your chances of being seen. ?  ?  ?[facility] ?[phone] ?Select option 1 for emergencies ?  ?Location: ?[facility] [facility], [employer], [address], [place] ?Clinic Hours: ?No walk-ins accepted - call the day before to schedule an appointment. ?Check in times are 9:30 am and 1:30 pm. ?Services: ?Simple extractions, temporary fillings, pulpectomy/pulp debridement, uncomplicated abscess drainage. ?Payment Options: ?PAYMENT IS DUE AT THE TIME OF SERVICE.  Fee is usually $100-200, additional surgical procedures (e.g. abscess drainage) may be extra. ?Cash, checks, Visa/MasterCard accepted.  Can file Medicaid if patient is covered for dental - patient should call case worker to check. ?No discount for [facility] patients. ?Best way to get seen: ?MUST call the day before and get onto  the schedule. Can usually be seen the next 1-2 days. No walk-ins accepted. ?  ?  ?[facility] ?[phone] ?  ?Location: ?[facility], [address], [place] ?Clinic Hours: ?M, W, Th, F 8am or 1:30pm, Tues 9a or 1:30 - first come/first served. ?Services: ?Simple extractions, temporary fillings, uncomplicated abscess drainage.  You do not need to be an [facility] resident. ?Payment Options: ?PAYMENT IS DUE AT THE TIME OF SERVICE. ?Dental insurance, otherwise sliding scale - bring proof of income or support. ?Depending on income and treatment needed, cost is usually $50-200. ?Best way to get seen: ?Arrive early as it is first come/first served. ?  ?  ?[facility] ?[phone] ?  ?Location: ?[address] ?Clinic Hours: ?Mon-Thu 8a-5p ?Services: ?Most basic dental services including extractions and fillings. ?Payment Options: ?PAYMENT IS DUE AT THE TIME OF SERVICE. ?Sliding scale, up to 50% off - bring proof if income or support. ?Medicaid with dental option accepted. ?Best way to get seen: ?Call to schedule an appointment, can usually be seen within 2 weeks OR they will try to see walk-ins - show up at 8a or 2p (you may have to wait). ?  ?  ?[facility] ?[phone] ?[place] RESIDENTS ONLY ?  ?Location: ?[facility] [employer], [address], [place], [place] [address] ?Clinic Hours: By appointment only. ?Monday - Thursday 8am-5pm, Friday 8am-12pm ?Services: Cleanings, fillings, extractions. ?Payment Options: ?PAYMENT IS DUE AT THE TIME OF SERVICE. ?Cash, Visa or MasterCard. Sliding scale - $30 minimum per service. ?Best way to get seen: ?Come in to office, complete packet and make an appointment - need proof of income ?or support monies for each household member and proof of [facility] residence. ?  Usually takes about a month to get in. ?  ?  ?[facility] ?[phone] ?   ?Location: ?[address], [facility] ?Clinic Hours: Walk-in Urgent Care Dental Services are offered Monday-Friday mornings only. ?The numbers of emergencies accepted daily is limited to the number of ?providers available. ?Maximum 15 - Mondays, Wednesdays & Thursdays ?Maximum 10 - Tuesdays & Fridays ?Services: ?You do not need to be a [facility] resident to be seen for a dental emergency. ?Emergencies are defined as pain, swelling, abnormal bleeding, or dental trauma. Walkins will receive x-rays if needed. ?NOTE: Dental cleaning is not an emergency. ?Payment Options: ?PAYMENT IS DUE AT THE TIME OF SERVICE. ?Minimum co-pay is $40.00 for uninsured patients. ?Minimum co-pay is $3.00 for Medicaid with dental coverage. ?Dental Insurance is accepted and must be presented at time of visit. ?Medicare does not cover dental. ?Forms of payment: Cash, credit card, checks. ?Best way to get seen: ?If not previously registered with the clinic, walk-in dental registration begins at 7:15 am and is on a first come/first serve basis. ?If previously registered with the clinic, call to make an appointment. ?  ?  ?[facility] ?[phone] ?[place] RESIDENTS ONLY ?  ?Location: ?[address], [place], [place] ?Clinic Hours: ?Mon-Thu 10a-2p ?Services: Extractions only! ?Payment Options: ?FREE (donations accepted) - bring proof of income or support ?Best way to get seen: ?Call and schedule an appointment OR come at 8am on the 1st Monday of every month (except for holidays) when it is first come/first served. ?  ?  ?[facility] ?[phone] ?  ?Location: ?[address], [place] ?Clinic Hours: ?Friday mornings ?Services, Payment Options, Best way to get seen: ?Call for info ? ?

## 2021-08-29 NOTE — ED Triage Notes (Signed)
Pt comes into the ED via POV c/o left side dental pain that started last night.  Pt unsure if it originally started on the upper or lower side of his teeth.  Pt in NAD at this time.  ?

## 2021-08-30 NOTE — ED Provider Notes (Signed)
? ? ?[facility] ?Emergency Department Provider Note ? ? ? ? Event Date/Time  ? First MD Initiated Contact with Patient 08/29/21 1712   ?  (approximate) ? ? ?History  ? ?Dental Pain ? ? ?HPI ? ?Edward Maldonado is a 36 y.o. male with a history of poorly controlled hypertension, presents to the ED with left-sided dental pain.  Patient reports pain that he is unable to localize to either his upper or lower jaw.  Denies any recent fevers, chills, sweats.  He reports onset last night.  He denies any focal gum swelling, purulent drainage, or difficulty controlling oral secretions. ?  ? ? ?Physical Exam  ? ?Triage Vital Signs: ?ED Triage Vitals [08/29/21 1653]  ?Enc Vitals Group  ?   BP (!) 180/112  ?   Pulse Rate 75  ?   Resp 17  ?   Temp 98.1 ?F (36.7 ?C)  ?   Temp Source Oral  ?   SpO2 97 %  ?   Weight 215 lb (97.5 kg)  ?   Height 5\' 11"  (1.803 m)  ?   Head Circumference   ?   Peak Flow   ?   Pain Score 10  ?   Pain Loc   ?   Pain Edu?   ?   Excl. in GC?   ? ? ?Most recent vital signs: ?Vitals:  ? 08/29/21 1653  ?BP: (!) 180/112  ?Pulse: 75  ?Resp: 17  ?Temp: 98.1 ?F (36.7 ?C)  ?SpO2: 97%  ? ? ?General Awake, no distress.  ?HEENT NCAT. PERRL. EOMI. No rhinorrhea. Mucous membranes are moist.  No focal gum swelling is appreciated.  Normal mandible range of motion.  TMs are clear bilaterally. ?CV:  Good peripheral perfusion.  ?RESP:  Normal effort.  ?ABD:  No distention.  ? ? ?ED Results / Procedures / Treatments  ? ?Labs ?(all labs ordered are listed, but only abnormal results are displayed) ?Labs Reviewed - No data to display ? ? ?EKG ? ? ?RADIOLOGY ? ? ?No results found. ? ? ?PROCEDURES: ? ?Critical Care performed: No ? ?Procedures ? ? ?MEDICATIONS ORDERED IN ED: ?Medications - No data to display ? ? ?IMPRESSION / MDM / ASSESSMENT AND PLAN / ED COURSE  ?I reviewed the triage vital signs and the nursing notes. ?             ?               ? ?Differential diagnosis includes, but is not limited  to, dental infection, dental injury, Ludwigs angina, TMJ dysfunction ? ?Patient to the ED for evaluation of left-sided dental pain.  He is concerned for possible dental infection.  No evidence of any acute sinus gum swelling, pointing, fluctuance.  No pointing, fluctuance, or brawny sublingual edema it is appreciated.  Patient's diagnosis is consistent with dental pain secondary to dental infection. Patient will be discharged home with prescriptions for amoxicillin and a small prescription for hydrocodone. Patient is to follow up with any number of dental clinics listed as needed or otherwise directed. Patient is given ED precautions to return to the ED for any worsening or new symptoms. ? ? ? ?FINAL CLINICAL IMPRESSION(S) / ED DIAGNOSES  ? ?Final diagnoses:  ?Toothache  ?Dental infection  ? ? ? ?Rx / DC Orders  ? ?ED Discharge Orders   ? ?      Ordered  ?  amoxicillin (AMOXIL) 500 MG capsule  3 times daily       ?  08/29/21 1755  ?  traMADol (ULTRAM) 50 MG tablet  2 times daily       ? 08/29/21 1755  ?  HYDROcodone-acetaminophen (NORCO) 5-325 MG tablet  3 times daily PRN       ? 08/29/21 1817  ? ?  ?  ? ?  ? ? ? ?Note:  This document was prepared using Dragon voice recognition software and may include unintentional dictation errors. ? ?  ?Lissa Hoard, PA-C ?08/30/21 2348 ? ?  ?Delton Prairie, MD ?09/01/21 [phone] ? ?

## 2022-09-30 ENCOUNTER — Emergency Department: Payer: BC Managed Care – PPO

## 2022-09-30 ENCOUNTER — Emergency Department
Admission: EM | Admit: 2022-09-30 | Discharge: 2022-09-30 | Disposition: A | Discharge status: Home / Self Care | Payer: BC Managed Care – PPO | Attending: Emergency Medicine | Admitting: Emergency Medicine

## 2022-09-30 ENCOUNTER — Other Ambulatory Visit: Payer: Self-pay

## 2022-09-30 DIAGNOSIS — W208XXA Other cause of strike by thrown, projected or falling object, initial encounter: Secondary | ICD-10-CM | POA: Insufficient documentation

## 2022-09-30 DIAGNOSIS — S4992XA Unspecified injury of left shoulder and upper arm, initial encounter: Secondary | ICD-10-CM | POA: Diagnosis present

## 2022-09-30 DIAGNOSIS — Y990 Civilian activity done for income or pay: Secondary | ICD-10-CM | POA: Diagnosis not present

## 2022-09-30 DIAGNOSIS — R030 Elevated blood-pressure reading, without diagnosis of hypertension: Secondary | ICD-10-CM | POA: Diagnosis not present

## 2022-09-30 MED ORDER — NAPROXEN 500 MG PO TABS: 500.0000 mg | ORAL_TABLET | Freq: Two times a day (BID) | ORAL | 0 refills | Status: AC

## 2022-09-30 NOTE — Discharge Instructions (Addendum)
Please schedule an appointment with the orthopedist for your shoulder injury. You may also take the medications for your pain. Please avoid exacerbating movements like overhead movements. Also, your blood pressure was elevated. Please schedule an appointment with PCP.  Please go to the following website to schedule new (and existing) patient appointments:   [email or web address]   The following is a list of primary care offices in the area who are accepting new patients at this time.  Please reach out to one of them directly and let them know you would like to schedule an appointment to follow up on an Emergency Department visit, and/or to establish a new primary care provider (PCP).  There are likely other primary care clinics in the are who are accepting new patients, but this is an excellent place to start:  [facility] Lead physician: Dr Shirlee Latch [address] [place], [place] [address] [phone]  [facility] Lead Physician: Dr Alba Cory [address], [place], [place] [address] [phone]  [facility]  Lead Physician: Dr Olevia Perches [address] [place], [place] [address] [phone]  [facility] Lead Physician: Dr Sofie Hartigan [address], [place], [place] [address] [phone]  [facility] Lead Physician: Dr Bari Edward [address] [place], [place], [place] [address] [phone]

## 2022-09-30 NOTE — ED Provider Notes (Signed)
[facility] Provider Note    Event Date/Time   First MD Initiated Contact with Patient 09/30/22 1133     (approximate)   History   Shoulder Injury   HPI  Edward Maldonado is a 37 y.o. male who presents today for evaluation of left shoulder pain.  Patient reports that at work yesterday a 500 pound door fell and struck his anterior shoulder and pushed him out of the way.  He denies history of LOC.  He did not fall to the ground.  He continued working, but at the end of his workday he noticed that he had some pain in his left shoulder, primarily with reaching overhead and with abduction of his shoulder.  He denies numbness or tingling.  No neck pain.  No weakness in his arm or decreased grip strength.  No previous shoulder injuries.  No other injury sustained.  There are no problems to display for this patient.         Physical Exam   Triage Vital Signs: ED Triage Vitals  Enc Vitals Group     BP 09/30/22 1109 (!) 194/121     Pulse Rate 09/30/22 1107 85     Resp 09/30/22 1107 18     Temp 09/30/22 1107 97.9 F (36.6 C)     Temp src --      SpO2 09/30/22 1107 97 %     Weight --      Height --      Head Circumference --      Peak Flow --      Pain Score 09/30/22 1108 7     Pain Loc --      Pain Edu? --      Excl. in GC? --     Most recent vital signs: Vitals:   09/30/22 1107 09/30/22 1109  BP:  (!) 194/121  Pulse: 85   Resp: 18   Temp: 97.9 F (36.6 C)   SpO2: 97%     Physical Exam Vitals and nursing note reviewed.  Constitutional:      General: Awake and alert. No acute distress.    Appearance: Normal appearance. The patient is normal weight.  HENT:     Head: Normocephalic and atraumatic.     Mouth: Mucous membranes are moist.  Eyes:     General: PERRL. Normal EOMs        Right eye: No discharge.        Left eye: No discharge.     Conjunctiva/sclera: Conjunctivae normal.  Cardiovascular:     Rate and Rhythm: Normal rate and  regular rhythm.     Pulses: Normal pulses.  Pulmonary:     Effort: Pulmonary effort is normal. No respiratory distress.     Breath sounds: Normal breath sounds.  Abdominal:     Abdomen is soft. There is no abdominal tenderness. No rebound or guarding. No distention. Musculoskeletal:        General: No swelling. Normal range of motion.     Cervical back: Normal range of motion and neck supple.  Left shoulder: No obvious deformity, swelling, ecchymosis, or erythema No clavicular or AC joint tenderness Able to actively and passively forward flex and abduct at shoulder fully, negative drop arm test Negative Obriens, empty can, and lift off tests Positive SLAP test Normal internal and external rotation against resistance Negative Hawkins and Neers Normal ROM at elbow and wrist Normal resisted pronation and supination 2+ radial pulse Normal grip strength Normal  intrinsic hand muscle function Skin:    General: Skin is warm and dry.     Capillary Refill: Capillary refill takes less than 2 seconds.     Findings: No rash.  Neurological:     Mental Status: The patient is awake and alert.      ED Results / Procedures / Treatments   Labs (all labs ordered are listed, but only abnormal results are displayed) Labs Reviewed - No data to display   EKG     RADIOLOGY I independently reviewed and interpreted imaging and agree with radiologists findings.     PROCEDURES:  Critical Care performed:   Procedures   MEDICATIONS ORDERED IN ED: Medications - No data to display   IMPRESSION / MDM / ASSESSMENT AND PLAN / ED COURSE  I reviewed the triage vital signs and the nursing notes.   Differential diagnosis includes, but is not limited to, fracture, dislocation, rotator cuff injury, strain, labral injury.  Patient is awake and alert, hemodynamically stable and neurovascularly intact.  He is hypertensive, however he denies headache, blurry vision, chest pain, or any other  symptoms related to his blood pressure.  We discussed the importance of hypertension management, and patient was given a list of PCPs in the area.  He reports that he was given amlodipine from the emergency department over a year ago, which she is no longer take.  He has never seen a PCP for his blood pressure before.  Because he is asymptomatic in terms of his hypertension today, will hold off on treatment, though emphasized the importance of close outpatient follow-up with PCP for initiation of blood pressure management as indicated.  As for his shoulder, patient has full range of motion of the shoulder.  X-ray obtained in triage is negative for bony injury.  His rotator cuff tests were largely negative, Neer's and Hawkins test were negative, do not suspect impingement syndrome.  He does have a positive SLAP test, concerning for labral injury.  We discussed the importance of close outpatient follow-up with orthopedics and the appropriate follow-up information was provided.  Discussed that he will likely get an MRI of his symptoms continue.  The meantime, we discussed rest, ice, refraining activities that exacerbate his pain, initiation of nonsteroidal anti-inflammatories.  He was advised that he cannot take naproxen with Advil, Aleve, ibuprofen, or other NSAIDs.  He was given a work note as requested.  We discussed return precautions.  Patient understands and agrees with plan.  He was discharged in stable condition.   Patient's presentation is most consistent with acute complicated illness / injury requiring diagnostic workup.    FINAL CLINICAL IMPRESSION(S) / ED DIAGNOSES   Final diagnoses:  Injury of left shoulder, initial encounter  Elevated blood pressure reading     Rx / DC Orders   ED Discharge Orders          Ordered    naproxen (NAPROSYN) 500 MG tablet  2 times daily with meals        09/30/22 1232             Note:  This document was prepared using Dragon voice recognition  software and may include unintentional dictation errors.   Keturah Shavers 09/30/22 1237    Concha Se, MD 10/01/22 [phone]

## 2022-09-30 NOTE — ED Triage Notes (Signed)
Pt comes with c/o left shoulder injury while at work today. Pt has 500 lbs door fell on it. Pt states this is WC.

## 2022-10-12 IMAGING — CR DG CHEST 2V
1 series · 2 of 2 positions shown · non-contrast
Comparison: Chest radiograph 10/26/2019

CLINICAL DATA: Wheezing, shortness of breath for 3 days

EXAM:
CHEST - 2 VIEW

[Series 1: dg chest 2 view · 0.14mm/px · 2 of 2 slices shown]
[im 1/2]
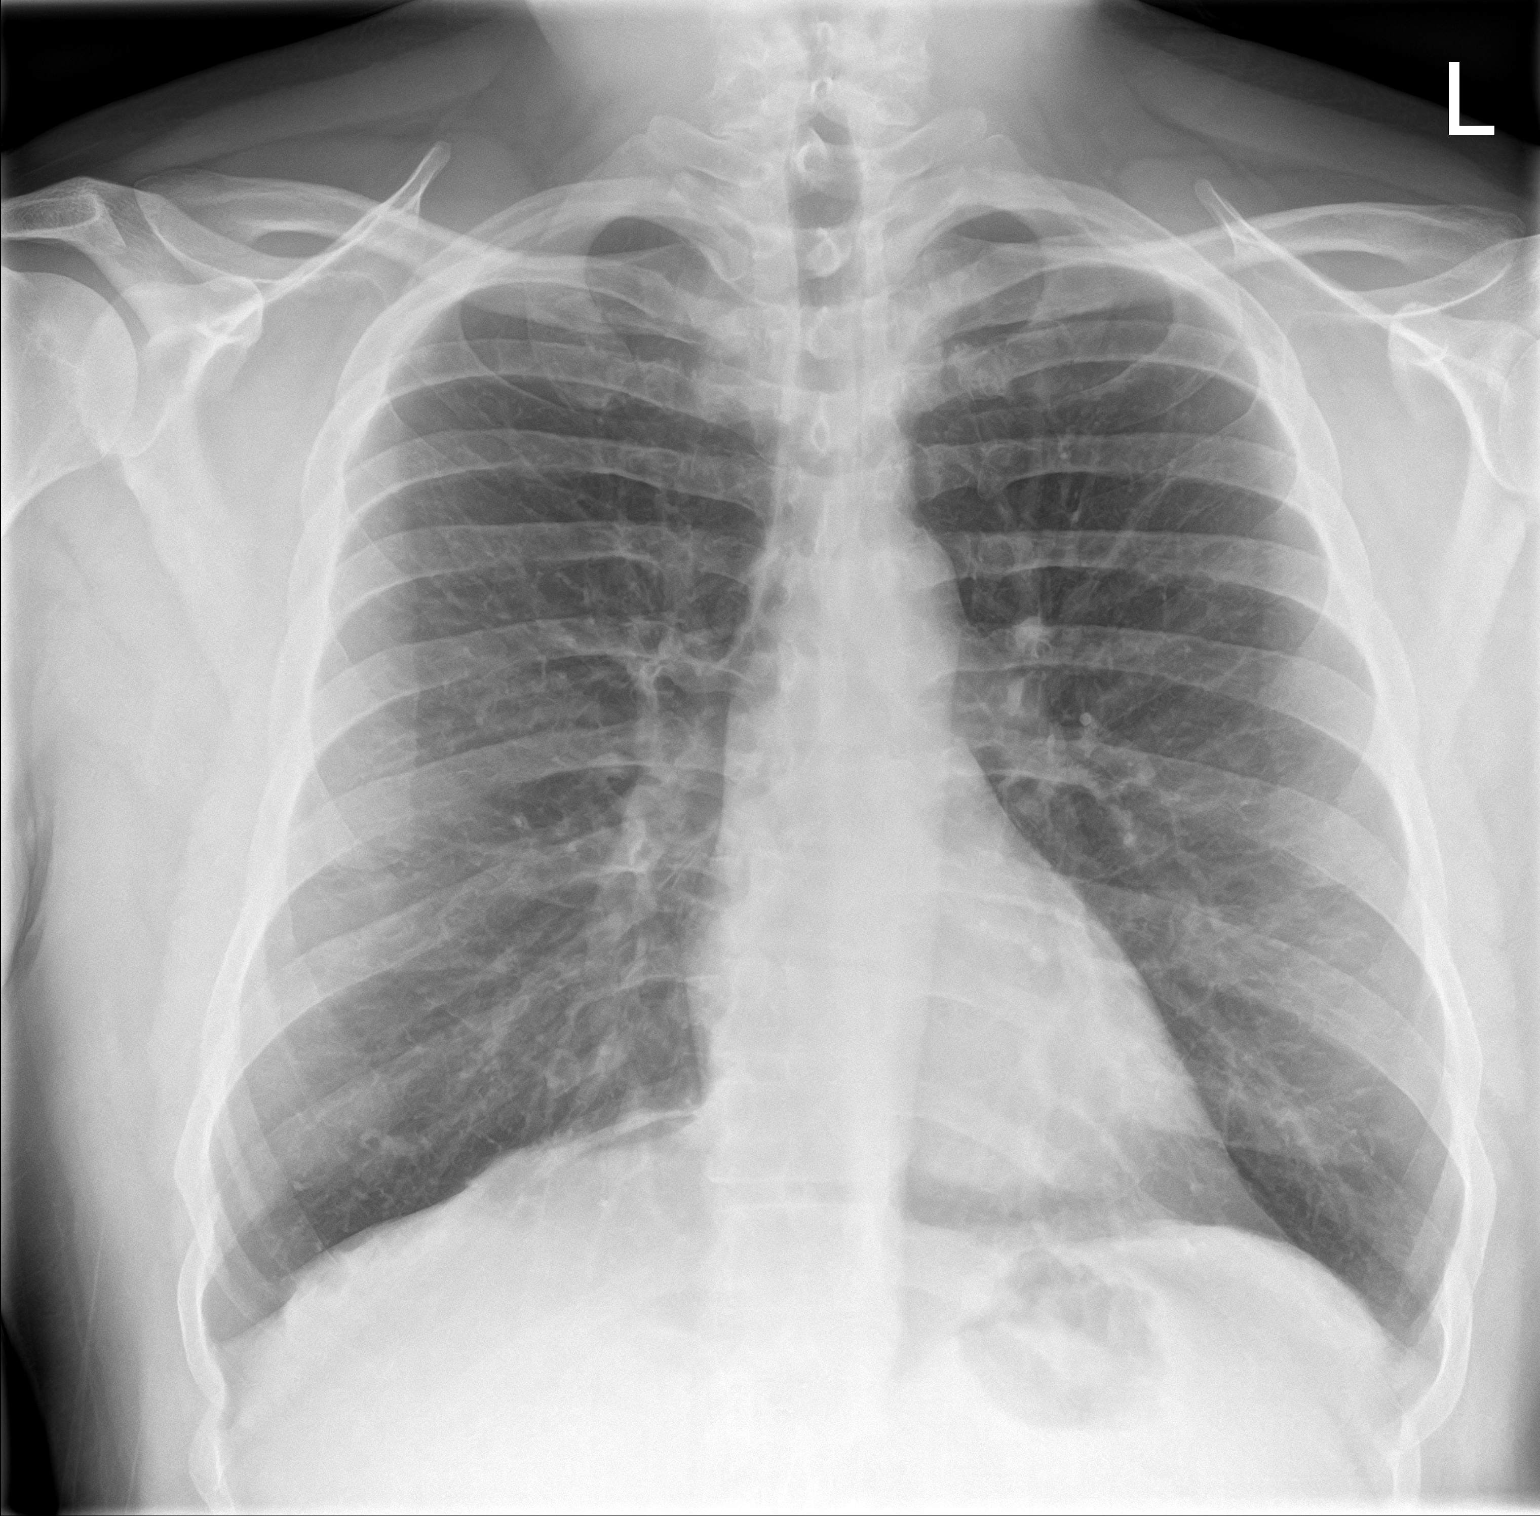
[im 2/2]
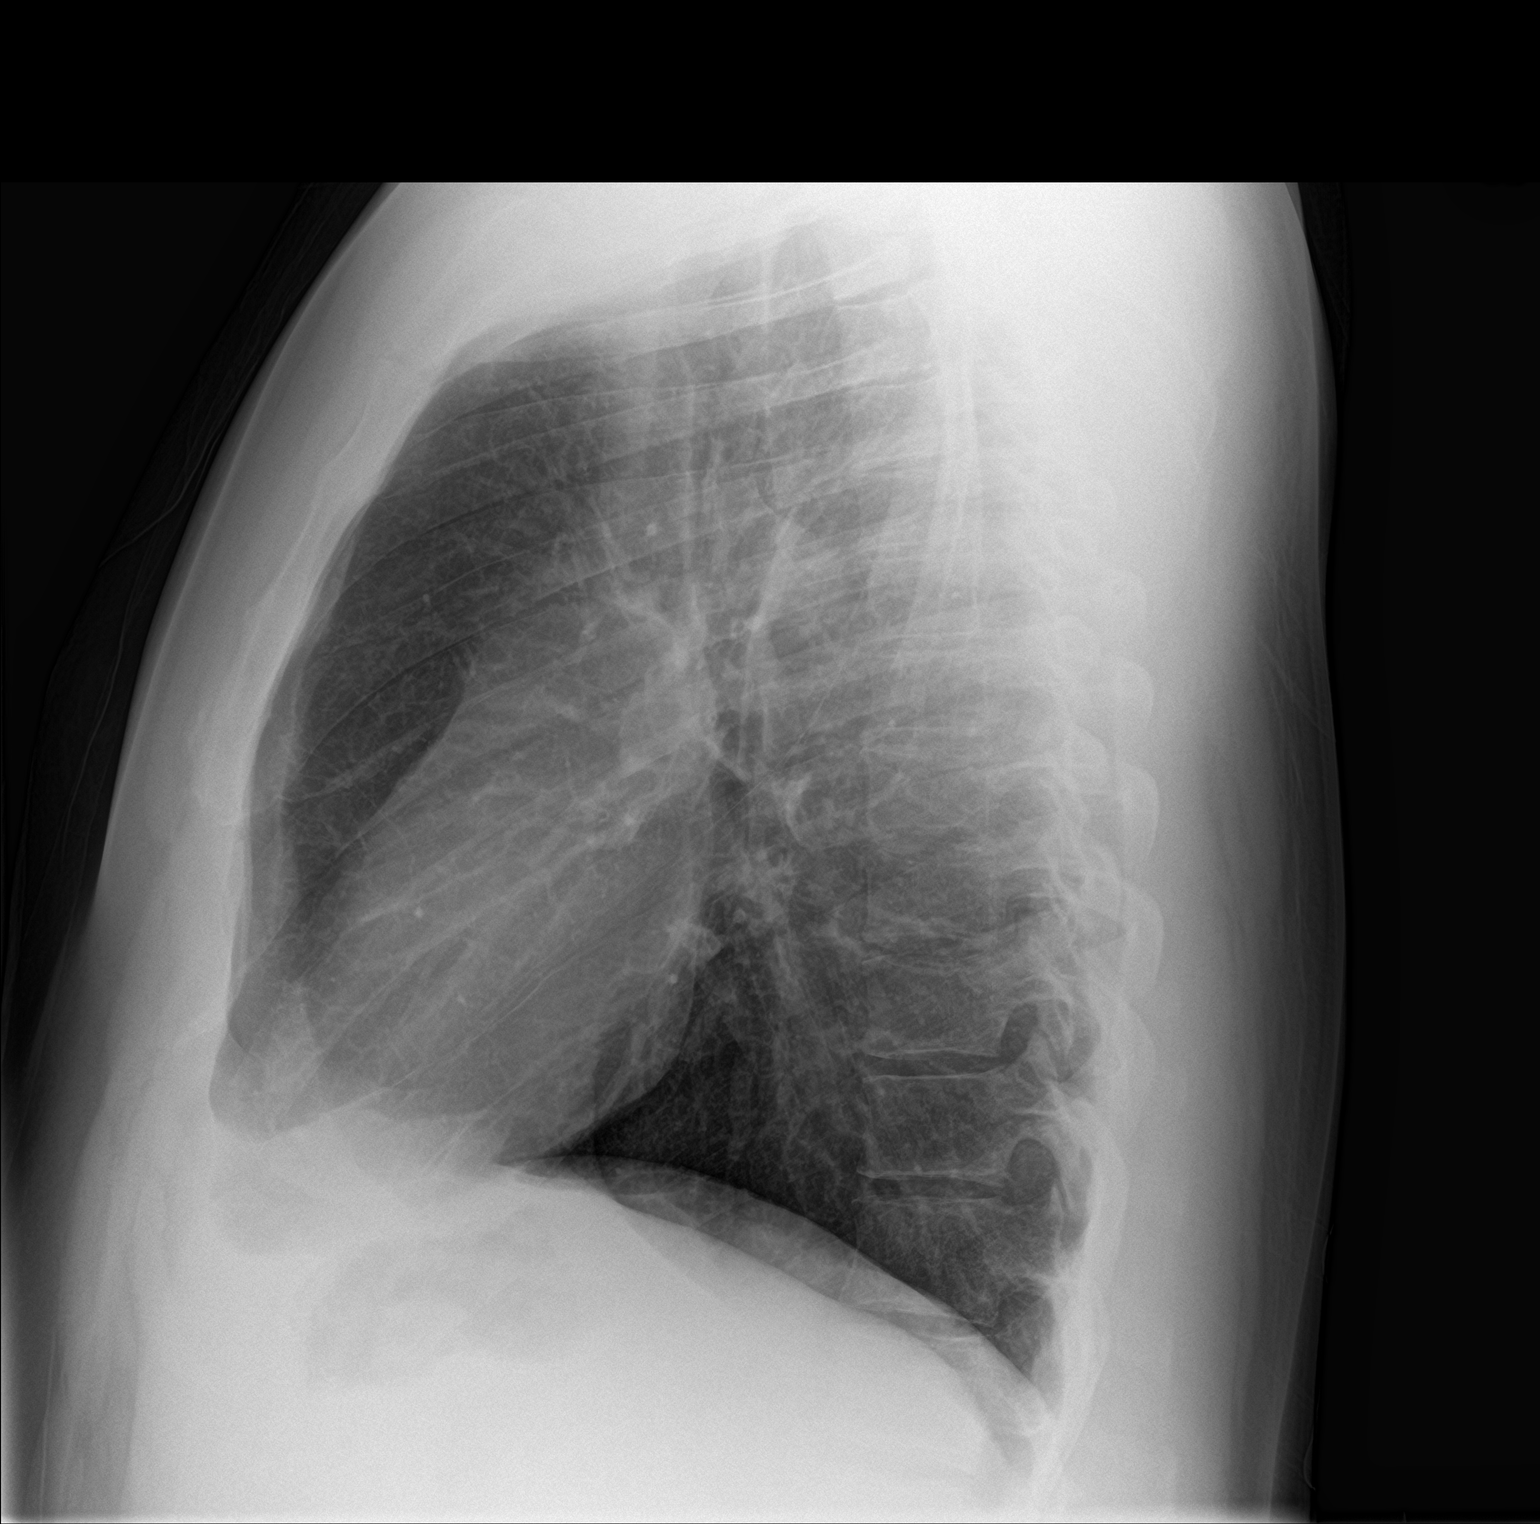

[2 of 2 positions shown; findings below may reference images not displayed]

FINDINGS: Cardiomediastinal silhouette is normal.

There is no focal consolidation or pulmonary edema. There is no
pleural effusion or pneumothorax.

There is no acute osseous abnormality.
IMPRESSION: No radiographic evidence of acute cardiopulmonary process.

## 2022-12-21 ENCOUNTER — Other Ambulatory Visit: Payer: Self-pay

## 2022-12-21 ENCOUNTER — Emergency Department: Payer: BC Managed Care – PPO

## 2022-12-21 ENCOUNTER — Emergency Department
Admission: EM | Admit: 2022-12-21 | Discharge: 2022-12-21 | Disposition: A | Discharge status: Home / Self Care | Payer: BC Managed Care – PPO | Attending: Emergency Medicine | Admitting: Emergency Medicine

## 2022-12-21 DIAGNOSIS — R519 Headache, unspecified: Secondary | ICD-10-CM | POA: Diagnosis present

## 2022-12-21 DIAGNOSIS — I10 Essential (primary) hypertension: Secondary | ICD-10-CM | POA: Diagnosis not present

## 2022-12-21 LAB — CBC
HCT: 46.6 % (ref 39.0–52.0)
Hemoglobin: 16.6 g/dL (ref 13.0–17.0)
MCH: 30.7 pg (ref 26.0–34.0)
MCHC: 35.6 g/dL (ref 30.0–36.0)
MCV: 86.1 fL (ref 80.0–100.0)
Platelets: 276 10*3/uL (ref 150–400)
RBC: 5.41 MIL/uL (ref 4.22–5.81)
RDW: 12.7 % (ref 11.5–15.5)
WBC: 12.2 10*3/uL — ABNORMAL HIGH (ref 4.0–10.5)
nRBC: 0 % (ref 0.0–0.2)

## 2022-12-21 LAB — BASIC METABOLIC PANEL
Anion gap: 9 (ref 5–15)
BUN: 12 mg/dL (ref 6–20)
CO2: 24 mmol/L (ref 22–32)
Calcium: 8.9 mg/dL (ref 8.9–10.3)
Chloride: 102 mmol/L (ref 98–111)
Creatinine, Ser: 0.77 mg/dL (ref 0.61–1.24)
GFR, Estimated: >60 mL/min (ref >60)
Glucose, Bld: 116 mg/dL — ABNORMAL HIGH (ref 70–99)
Potassium: 3.9 mmol/L (ref 3.5–5.1)
Sodium: 135 mmol/L (ref 135–145)

## 2022-12-21 MED ORDER — KETOROLAC TROMETHAMINE 30 MG/ML IJ SOLN
15.0000 mg | Freq: Once | INTRAMUSCULAR | Status: AC
  Administered 2022-12-21: 15 mg via INTRAVENOUS
  Filled 2022-12-21: qty 1

## 2022-12-21 MED ORDER — DIPHENHYDRAMINE HCL 50 MG/ML IJ SOLN
25.0000 mg | Freq: Once | INTRAMUSCULAR | Status: AC
  Administered 2022-12-21: 25 mg via INTRAVENOUS
  Filled 2022-12-21: qty 1

## 2022-12-21 MED ORDER — PROCHLORPERAZINE EDISYLATE 10 MG/2ML IJ SOLN
10.0000 mg | Freq: Once | INTRAMUSCULAR | Status: AC
  Administered 2022-12-21: 10 mg via INTRAVENOUS
  Filled 2022-12-21: qty 2

## 2022-12-21 MED ORDER — ONDANSETRON HCL 4 MG/2ML IJ SOLN
4.0000 mg | Freq: Once | INTRAMUSCULAR | Status: AC
  Administered 2022-12-21: 4 mg via INTRAVENOUS
  Filled 2022-12-21: qty 2

## 2022-12-21 MED ORDER — PROCHLORPERAZINE MALEATE 10 MG PO TABS: 10.0000 mg | ORAL_TABLET | Freq: Four times a day (QID) | ORAL | 0 refills | Status: AC | PRN

## 2022-12-21 MED ORDER — AMLODIPINE BESYLATE 5 MG PO TABS: 5.0000 mg | ORAL_TABLET | Freq: Every day | ORAL | 1 refills | Status: AC

## 2022-12-21 MED ORDER — AMLODIPINE BESYLATE 5 MG PO TABS
5.0000 mg | ORAL_TABLET | Freq: Once | ORAL | Status: AC
  Administered 2022-12-21: 5 mg via ORAL
  Filled 2022-12-21: qty 1

## 2022-12-21 MED ORDER — LACTATED RINGERS IV BOLUS
1000.0000 mL | Freq: Once | INTRAVENOUS | Status: AC
  Administered 2022-12-21: 1000 mL via INTRAVENOUS

## 2022-12-21 NOTE — ED Provider Notes (Signed)
[facility] Provider Note    Event Date/Time   First MD Initiated Contact with Patient 12/21/22 1817     (approximate)   History   Chief Complaint Headache   HPI  Edward Maldonado is a 37 y.o. male with past medical history of hypertension who presents to the ED complaining of headache.  Patient reports that he has had a gradually worsening headache to the backside of his head since last night.  He describes it as a constant throbbing that is worse when he is exposed to bright lights.  He denies any associated nausea or vomiting, has not had any fevers or neck stiffness.  He also denies any vision changes, speech changes, numbness, or weakness.  He reports being told that he has high blood pressure in the past, was previously prescribed amlodipine from the ED but has not taken it since his 30-day supply ran out.     Physical Exam   Triage Vital Signs: ED Triage Vitals  Encounter Vitals Group     BP 12/21/22 1504 (!) 223/133     Systolic BP Percentile --      Diastolic BP Percentile --      Pulse Rate 12/21/22 1504 91     Resp 12/21/22 1504 18     Temp 12/21/22 1504 97.9 F (36.6 C)     Temp src --      SpO2 12/21/22 1504 99 %     Weight --      Height --      Head Circumference --      Peak Flow --      Pain Score 12/21/22 1503 10     Pain Loc --      Pain Education --      Exclude from Growth Chart --     Most recent vital signs: Vitals:   12/21/22 1930 12/21/22 1943  BP:  (!) 221/124  Pulse:  63  Resp:  20  Temp: 98.2 F (36.8 C)   SpO2:  100%    Constitutional: Alert and oriented. Eyes: Conjunctivae are normal.  Pupils equal, round, and reactive to light bilaterally. Head: Atraumatic. Nose: No congestion/rhinnorhea. Mouth/Throat: Mucous membranes are moist.  Neck: Supple with no meningismus. Cardiovascular: Normal rate, regular rhythm. Grossly normal heart sounds.  2+ radial pulses bilaterally. Respiratory: Normal  respiratory effort.  No retractions. Lungs CTAB. Gastrointestinal: Soft and nontender. No distention. Musculoskeletal: No lower extremity tenderness nor edema.  Neurologic:  Normal speech and language. No gross focal neurologic deficits are appreciated.    ED Results / Procedures / Treatments   Labs (all labs ordered are listed, but only abnormal results are displayed) Labs Reviewed  CBC - Abnormal; Notable for the following components:      Result Value   WBC 12.2 (*)    All other components within normal limits  BASIC METABOLIC PANEL - Abnormal; Notable for the following components:   Glucose, Bld 116 (*)    All other components within normal limits   RADIOLOGY CT head reviewed and interpreted by me with no hemorrhage or midline shift.  PROCEDURES:  Critical Care performed: No  Procedures   MEDICATIONS ORDERED IN ED: Medications  prochlorperazine (COMPAZINE) injection 10 mg (10 mg Intravenous Given 12/21/22 1841)  diphenhydrAMINE (BENADRYL) injection 25 mg (25 mg Intravenous Given 12/21/22 1841)  amLODipine (NORVASC) tablet 5 mg (5 mg Oral Given 12/21/22 1841)  lactated ringers bolus 1,000 mL (0 mLs Intravenous Stopped 12/21/22 1931)  ondansetron [facility]) injection 4 mg (4 mg Intravenous Given 12/21/22 1858)  ketorolac (TORADOL) 30 MG/ML injection 15 mg (15 mg Intravenous Given 12/21/22 1859)     IMPRESSION / MDM / ASSESSMENT AND PLAN / ED COURSE  I reviewed the triage vital signs and the nursing notes.                              37 y.o. male with past medical history of hypertension who presents to the ED complaining of gradually worsening posterior headache since yesterday evening associated with some light sensitivity.  Patient's presentation is most consistent with acute presentation with potential threat to life or bodily function.  Differential diagnosis includes, but is not limited to, SAH, meningitis, tension headache, migraine headache, uncontrolled  hypertension, medication noncompliance, AKI, anemia, or electrolyte abnormality.  Patient nontoxic-appearing and in no acute distress, vital signs remarkable for hypertension but otherwise reassuring.  CT head is negative for acute process and he has a nonfocal neurologic exam, with gradually worsening symptoms since last night I have low suspicion for [facility].  No findings concerning for meningitis and I suspect tension headache versus migraine headache.  We will treat with IV Compazine and Benadryl, hydrate with IV fluids.  Labs are reassuring with no significant anemia, leukocytosis, electrode abnormality, or AKI.  No evidence of hypertensive emergency at this time and we will restart him on amlodipine with first dose here in the ED.  On reassessment, patient reports that headache has almost entirely resolved.  His blood pressure remains elevated but there is no evidence of hypertensive emergency at this time and he is appropriate for outpatient follow-up.  We will restart his amlodipine and he was counseled to return to the ED for new or worsening symptoms.  Patient agrees with plan.      FINAL CLINICAL IMPRESSION(S) / ED DIAGNOSES   Final diagnoses:  Acute nonintractable headache, unspecified headache type  Uncontrolled hypertension     Rx / DC Orders   ED Discharge Orders          Ordered    amLODipine (NORVASC) 5 MG tablet  Daily        12/21/22 2014    prochlorperazine (COMPAZINE) 10 MG tablet  Every 6 hours PRN        12/21/22 2014             Note:  This document was prepared using Dragon voice recognition software and may include unintentional dictation errors.   Chesley Noon, MD 12/21/22 2015

## 2022-12-21 NOTE — ED Triage Notes (Signed)
Pt comes with c/o headache that started last night. Pt states it is throbbing in back of head and his neck hurts.

## 2023-02-08 NOTE — Progress Notes (Deleted)
New patient visit  Patient: Edward Maldonado   DOB: May 01, 1986   37 y.o. Male  MRN: [id number] Visit Date: 02/13/2023  Today's healthcare provider: Debera Lat, PA-C   No chief complaint on file.  Subjective    Edward Maldonado is a 37 y.o. male who presents today as a new patient to establish care.  HPI  *** Discussed the use of AI scribe software for clinical note transcription with the patient, who gave verbal consent to proceed.  History of Present Illness            Past Medical History:  Diagnosis Date   Hypertension    No past surgical history on file. No family status information on file.   No family history on file. Social History   Socioeconomic History   Marital status: Single    Spouse name: Not on file   Number of children: Not on file   Years of education: Not on file   Highest education level: Not on file  Occupational History   Not on file  Tobacco Use   Smoking status: Every Day   Smokeless tobacco: Never  Substance and Sexual Activity   Alcohol use: Never   Drug use: Never   Sexual activity: Not on file  Other Topics Concern   Not on file  Social History Narrative   Not on file   Social Determinants of Health   Financial Resource Strain: Not on file  Food Insecurity: Not on file  Transportation Needs: Not on file  Physical Activity: Not on file  Stress: Not on file  Social Connections: Not on file   Outpatient Medications Prior to Visit  Medication Sig   albuterol (VENTOLIN HFA) 108 (90 Base) MCG/ACT inhaler Inhale 2 puffs into the lungs every 6 (six) hours as needed for wheezing or shortness of breath.   amLODipine (NORVASC) 5 MG tablet Take 1 tablet (5 mg total) by mouth daily.   amoxicillin (AMOXIL) 500 MG capsule Take 1 capsule (500 mg total) by mouth 3 (three) times daily.   prochlorperazine (COMPAZINE) 10 MG tablet Take 1 tablet (10 mg total) by mouth every 6 (six) hours as needed.   No facility-administered  medications prior to visit.   No Known Allergies   There is no immunization history on file for this patient.  Health Maintenance  Topic Date Due   HIV Screening  Never done   Hepatitis C Screening  Never done   DTaP/Tdap/Td (1 - Tdap) Never done   INFLUENZA VACCINE  Never done   COVID-19 Vaccine (1 - 2023-24 season) Never done   HPV VACCINES  Aged Out    Patient Care Team: Pcp, No as PCP - General  Review of Systems  All other systems reviewed and are negative.  Except see HPI   {Insert previous labs (optional):23779} {See past labs  Heme  Chem  Endocrine  Serology  Results Review (optional):1}   Objective    There were no vitals taken for this visit. {Insert last BP/Wt (optional):23777}{See vitals history (optional):1}   Physical Exam Vitals reviewed.  Constitutional:      General: He is not in acute distress.    Appearance: Normal appearance. He is not diaphoretic.  HENT:     Head: Normocephalic and atraumatic.  Eyes:     General: No scleral icterus.    Conjunctiva/sclera: Conjunctivae normal.  Cardiovascular:     Rate and Rhythm: Normal rate and regular rhythm.     Pulses: Normal pulses.  Heart sounds: Normal heart sounds. No murmur heard. Pulmonary:     Effort: Pulmonary effort is normal. No respiratory distress.     Breath sounds: Normal breath sounds. No wheezing or rhonchi.  Musculoskeletal:     Cervical back: Neck supple.     Right lower leg: No edema.     Left lower leg: No edema.  Lymphadenopathy:     Cervical: No cervical adenopathy.  Skin:    General: Skin is warm and dry.     Findings: No rash.  Neurological:     Mental Status: He is alert and oriented to person, place, and time. Mental status is at baseline.  Psychiatric:        Mood and Affect: Mood normal.        Behavior: Behavior normal.     Depression Screen     No data to display         No results found for any visits on 02/13/23.  Assessment & Plan      *** Assessment and Plan              Encounter to establish care Welcomed to our clinic Reviewed past medical hx, social hx, family hx and surgical hx Pt advised to send all vaccination records or screening   No follow-ups on file.    The patient was advised to call back or seek an in-person evaluation if the symptoms worsen or if the condition fails to improve as anticipated.  I discussed the assessment and treatment plan with the patient. The patient was provided an opportunity to ask questions and all were answered. The patient agreed with the plan and demonstrated an understanding of the instructions.  I, Debera Lat, PA-C have reviewed all documentation for this visit. The documentation on 02/13/23  for the exam, diagnosis, procedures, and orders are all accurate and complete.  Debera Lat, [facility], MMS [facility] [phone] (phone) [phone] (fax)  [facility]

## 2023-02-13 ENCOUNTER — Ambulatory Visit: Payer: Self-pay | Admitting: Physician Assistant

## 2023-02-13 DIAGNOSIS — R519 Headache, unspecified: Secondary | ICD-10-CM

## 2023-02-13 DIAGNOSIS — Z7689 Persons encountering health services in other specified circumstances: Secondary | ICD-10-CM
